# Patient Record
Sex: Female | Born: 1985 | Race: White | Hispanic: No | Marital: Single | Smoking: Former smoker
Health system: Southern US, Community
[De-identification: ages and names within clinical notes are randomized; demographics above are authoritative.]

## PROBLEM LIST (undated history)

## (undated) DIAGNOSIS — R768 Other specified abnormal immunological findings in serum: Secondary | ICD-10-CM

## (undated) DIAGNOSIS — F1110 Opioid abuse, uncomplicated: Secondary | ICD-10-CM

## (undated) DIAGNOSIS — B009 Herpesviral infection, unspecified: Secondary | ICD-10-CM

## (undated) HISTORY — PX: NO PAST SURGERIES: SHX2092

---

## 2009-01-29 ENCOUNTER — Emergency Department (HOSPITAL_COMMUNITY): Admission: EM | Admit: 2009-01-29 | Discharge: 2009-01-29 | Payer: Self-pay | Admitting: Emergency Medicine

## 2009-02-12 ENCOUNTER — Emergency Department (HOSPITAL_COMMUNITY): Admission: EM | Admit: 2009-02-12 | Discharge: 2009-02-12 | Payer: Self-pay | Admitting: Emergency Medicine

## 2009-02-20 ENCOUNTER — Emergency Department (HOSPITAL_COMMUNITY): Admission: EM | Admit: 2009-02-20 | Discharge: 2009-02-20 | Payer: Self-pay | Admitting: Emergency Medicine

## 2009-10-06 ENCOUNTER — Emergency Department (HOSPITAL_COMMUNITY): Admission: EM | Admit: 2009-10-06 | Discharge: 2009-10-06 | Payer: Self-pay | Admitting: Emergency Medicine

## 2009-10-07 ENCOUNTER — Emergency Department (HOSPITAL_COMMUNITY): Admission: EM | Admit: 2009-10-07 | Discharge: 2009-10-08 | Payer: Self-pay | Admitting: Emergency Medicine

## 2010-10-31 LAB — DIFFERENTIAL
Basophils Absolute: 0 10*3/uL (ref 0.0–0.1)
Basophils Relative: 1 % (ref 0–1)
Lymphocytes Relative: 31 % (ref 12–46)
Monocytes Absolute: 0.6 10*3/uL (ref 0.1–1.0)
Neutro Abs: 4.2 10*3/uL (ref 1.7–7.7)
Neutrophils Relative %: 59 % (ref 43–77)

## 2010-10-31 LAB — RAPID URINE DRUG SCREEN, HOSP PERFORMED
Amphetamines: NOT DETECTED
Tetrahydrocannabinol: NOT DETECTED

## 2010-10-31 LAB — CBC
HCT: 37.8 % (ref 36.0–46.0)
Hemoglobin: 13.1 g/dL (ref 12.0–15.0)
MCHC: 34.8 g/dL (ref 30.0–36.0)
MCV: 88.5 fL (ref 78.0–100.0)
Platelets: 297 10*3/uL (ref 150–400)
RBC: 4.26 MIL/uL (ref 3.87–5.11)
RDW: 12.4 % (ref 11.5–15.5)

## 2010-10-31 LAB — COMPREHENSIVE METABOLIC PANEL
Alkaline Phosphatase: 72 U/L (ref 39–117)
CO2: 29 mEq/L (ref 19–32)
Creatinine, Ser: 0.97 mg/dL (ref 0.4–1.2)
Glucose, Bld: 95 mg/dL (ref 70–99)
Total Bilirubin: 0.6 mg/dL (ref 0.3–1.2)

## 2010-10-31 LAB — WOUND CULTURE

## 2010-10-31 LAB — POCT PREGNANCY, URINE: Preg Test, Ur: NEGATIVE

## 2010-11-13 LAB — DIFFERENTIAL
Basophils Absolute: 0 10*3/uL (ref 0.0–0.1)
Basophils Relative: 1 % (ref 0–1)
Eosinophils Absolute: 0 10*3/uL (ref 0.0–0.7)
Eosinophils Relative: 1 % (ref 0–5)
Lymphocytes Relative: 25 % (ref 12–46)
Lymphs Abs: 1.1 10*3/uL (ref 0.7–4.0)
Monocytes Absolute: 0.5 10*3/uL (ref 0.1–1.0)
Monocytes Relative: 12 % (ref 3–12)
Neutro Abs: 2.6 10*3/uL (ref 1.7–7.7)
Neutrophils Relative %: 62 % (ref 43–77)

## 2010-11-13 LAB — CBC
HCT: 40.6 % (ref 36.0–46.0)
Hemoglobin: 14 g/dL (ref 12.0–15.0)
MCHC: 34.5 g/dL (ref 30.0–36.0)
MCV: 91.9 fL (ref 78.0–100.0)
Platelets: 285 10*3/uL (ref 150–400)
RBC: 4.42 MIL/uL (ref 3.87–5.11)
RDW: 11.5 % (ref 11.5–15.5)
WBC: 4.3 10*3/uL (ref 4.0–10.5)

## 2010-11-13 LAB — RAPID URINE DRUG SCREEN, HOSP PERFORMED
Amphetamines: NOT DETECTED
Barbiturates: NOT DETECTED
Benzodiazepines: NOT DETECTED
Cocaine: POSITIVE — AB
Opiates: POSITIVE — AB
Tetrahydrocannabinol: NOT DETECTED

## 2010-11-13 LAB — POCT I-STAT, CHEM 8
Calcium, Ion: 1.22 mmol/L (ref 1.12–1.32)
Creatinine, Ser: 0.8 mg/dL (ref 0.4–1.2)
HCT: 41 % (ref 36.0–46.0)
TCO2: 27 mmol/L (ref 0–100)

## 2010-11-13 LAB — URINALYSIS, ROUTINE W REFLEX MICROSCOPIC
Ketones, ur: NEGATIVE mg/dL
Protein, ur: NEGATIVE mg/dL
Specific Gravity, Urine: 1.026 (ref 1.005–1.030)
Urobilinogen, UA: 1 mg/dL (ref 0.0–1.0)
pH: 7 (ref 5.0–8.0)

## 2010-11-13 LAB — ETHANOL: Alcohol, Ethyl (B): <5 mg/dL (ref 0–10)

## 2010-11-13 LAB — URINE MICROSCOPIC-ADD ON

## 2010-11-13 LAB — POCT PREGNANCY, URINE: Preg Test, Ur: NEGATIVE

## 2011-04-23 ENCOUNTER — Emergency Department (HOSPITAL_COMMUNITY)
Admission: EM | Admit: 2011-04-23 | Discharge: 2011-04-23 | Disposition: A | Discharge status: Home / Self Care | Payer: Self-pay | Attending: Emergency Medicine | Admitting: Emergency Medicine

## 2011-04-23 DIAGNOSIS — N390 Urinary tract infection, site not specified: Secondary | ICD-10-CM | POA: Insufficient documentation

## 2011-04-23 DIAGNOSIS — F1910 Other psychoactive substance abuse, uncomplicated: Secondary | ICD-10-CM | POA: Insufficient documentation

## 2011-04-23 LAB — URINALYSIS, ROUTINE W REFLEX MICROSCOPIC
Ketones, ur: NEGATIVE mg/dL
Nitrite: NEGATIVE
Urobilinogen, UA: 0.2 mg/dL (ref 0.0–1.0)

## 2011-04-23 LAB — CBC
Hemoglobin: 15.8 g/dL — ABNORMAL HIGH (ref 12.0–15.0)
MCV: 88.9 fL (ref 78.0–100.0)
RDW: 12.4 % (ref 11.5–15.5)

## 2011-04-23 LAB — RAPID URINE DRUG SCREEN, HOSP PERFORMED
Amphetamines: NOT DETECTED
Benzodiazepines: POSITIVE — AB
Cocaine: NOT DETECTED

## 2011-04-23 LAB — DIFFERENTIAL
Basophils Relative: 1 % (ref 0–1)
Eosinophils Absolute: 0 10*3/uL (ref 0.0–0.7)
Eosinophils Relative: 0 % (ref 0–5)
Lymphocytes Relative: 15 % (ref 12–46)
Neutro Abs: 8.4 10*3/uL — ABNORMAL HIGH (ref 1.7–7.7)
Neutrophils Relative %: 75 % (ref 43–77)

## 2011-04-23 LAB — COMPREHENSIVE METABOLIC PANEL
ALT: 10 U/L (ref 0–35)
AST: 16 U/L (ref 0–37)
Albumin: 4.4 g/dL (ref 3.5–5.2)
Alkaline Phosphatase: 73 U/L (ref 39–117)
Chloride: 94 mEq/L — ABNORMAL LOW (ref 96–112)
Potassium: 3.9 mEq/L (ref 3.5–5.1)
Sodium: 132 mEq/L — ABNORMAL LOW (ref 135–145)
Total Bilirubin: 1.4 mg/dL — ABNORMAL HIGH (ref 0.3–1.2)

## 2011-04-23 LAB — URINE MICROSCOPIC-ADD ON

## 2011-04-23 LAB — ACETAMINOPHEN LEVEL: Acetaminophen (Tylenol), Serum: <15 ug/mL (ref 10–30)

## 2011-11-18 ENCOUNTER — Emergency Department (HOSPITAL_COMMUNITY)
Admission: EM | Admit: 2011-11-18 | Discharge: 2011-11-19 | Disposition: A | Discharge status: Home / Self Care | Payer: Self-pay | Attending: Emergency Medicine | Admitting: Emergency Medicine

## 2011-11-18 ENCOUNTER — Emergency Department (HOSPITAL_COMMUNITY): Payer: Self-pay

## 2011-11-18 DIAGNOSIS — R4182 Altered mental status, unspecified: Secondary | ICD-10-CM | POA: Insufficient documentation

## 2011-11-18 DIAGNOSIS — F1910 Other psychoactive substance abuse, uncomplicated: Secondary | ICD-10-CM

## 2011-11-18 DIAGNOSIS — T401X1A Poisoning by heroin, accidental (unintentional), initial encounter: Secondary | ICD-10-CM | POA: Insufficient documentation

## 2011-11-18 DIAGNOSIS — R0989 Other specified symptoms and signs involving the circulatory and respiratory systems: Secondary | ICD-10-CM | POA: Insufficient documentation

## 2011-11-18 DIAGNOSIS — T401X4A Poisoning by heroin, undetermined, initial encounter: Secondary | ICD-10-CM | POA: Insufficient documentation

## 2011-11-18 DIAGNOSIS — R5381 Other malaise: Secondary | ICD-10-CM | POA: Insufficient documentation

## 2011-11-18 LAB — SALICYLATE LEVEL: Salicylate Lvl: <2 mg/dL — ABNORMAL LOW (ref 2.8–20.0)

## 2011-11-18 LAB — DIFFERENTIAL
Basophils Relative: 0 % (ref 0–1)
Eosinophils Absolute: 0.1 10*3/uL (ref 0.0–0.7)
Lymphs Abs: 1.2 10*3/uL (ref 0.7–4.0)
Monocytes Absolute: 0.7 10*3/uL (ref 0.1–1.0)
Monocytes Relative: 9 % (ref 3–12)

## 2011-11-18 LAB — CBC
HCT: 37.2 % (ref 36.0–46.0)
Hemoglobin: 12.8 g/dL (ref 12.0–15.0)
MCH: 30.3 pg (ref 26.0–34.0)
MCHC: 34.4 g/dL (ref 30.0–36.0)
RBC: 4.22 MIL/uL (ref 3.87–5.11)

## 2011-11-18 LAB — RAPID URINE DRUG SCREEN, HOSP PERFORMED
Amphetamines: POSITIVE — AB
Barbiturates: NOT DETECTED
Benzodiazepines: POSITIVE — AB

## 2011-11-18 LAB — BASIC METABOLIC PANEL
BUN: 10 mg/dL (ref 6–23)
Chloride: 102 mEq/L (ref 96–112)
Creatinine, Ser: 0.6 mg/dL (ref 0.50–1.10)
GFR calc Af Amer: >90 mL/min (ref >90)
Glucose, Bld: 99 mg/dL (ref 70–99)

## 2011-11-18 LAB — ETHANOL: Alcohol, Ethyl (B): <11 mg/dL (ref 0–11)

## 2011-11-18 MED ORDER — SODIUM CHLORIDE 0.9 % IV SOLN
INTRAVENOUS | Status: DC
  Administered 2011-11-18: 17:00:00 via INTRAVENOUS

## 2011-11-18 MED ORDER — NALOXONE HCL 0.4 MG/ML IJ SOLN: 0.4000 mg | INTRAMUSCULAR | Status: DC | PRN

## 2011-11-18 NOTE — BH Assessment (Signed)
IVC paperwork signed and faxed by Clinical research associate. [employer] confirms receipt.

## 2011-11-18 NOTE — ED Provider Notes (Addendum)
Signed out by Dr Fredricka Bonine that pt had unintentional heroine od. To observe in ED, anticipating will be able to be discharge home. On recheck pt declines wanting substance abuse rehab/detox.  On multiple rechecks pt either awake, or resting and easily arousable at which time is alert, oriented. Pt denies any c/o. Requests d/c. Will have staff work w pt to find responsible adult.  Pt continues to deny any thought of harm to self.  Have encouraged patient to consider substance abuse rehab/detox, pt refuses.    Additional recheck 2300, alert, content. Hr 94 rr 16, pulse ox 98%.   Recheck [phone] - remains fully awake and alert. No resp depression. No sob. Continues to request d/c and again refuses substance abuse rehab/detox program or other inpt stay.      Suzi Roots, MD 11/18/11 6213  Suzi Roots, MD 11/19/11 [phone]

## 2011-11-18 NOTE — ED Notes (Signed)
I called security after RN Vincenza Hews felt that the patient was a flight risk. I waited by the patients room until Security officer Katrina was standing by and explained to her that the patient was flight risk. Approx. 2 minutes later, Vincenza Hews asked me where the patient was. I was not aware that she was missing. I ran out the ambulance bay and saw that [employer] had caught the patient across the street. The patient was brought back to room 15.

## 2011-11-18 NOTE — ED Notes (Signed)
At approx 1940, pt became less cooperative, agitated, reported that she was wanting to leave. Pt still with slurred speech and falling asleep during sentences. NT was instructed to call security. Security came to bedside and pt began to run, pulling PIV. MD notified of patient erratic and unsafe behavior, MD orders IVC.

## 2011-11-18 NOTE — ED Provider Notes (Addendum)
History     CSN: [id number]  Arrival date & time 11/18/11  1425   First MD Initiated Contact with Patient 11/18/11 1442      Chief Complaint  Patient presents with  . 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 She was found today in the restroom at a local laundromat/bar passed out and unresponsive. EMS were called and found the patient to be unresponsive, gave a dose of naloxone IV with temporary resolution of altered mental status and lucid interval during which the patient admitted to using heroin today, denies intentional overdose. On arrival in the emergency department, the patient is lethargic but responsive to verbal stimulus, oriented appropriately, able to answer some questions. She again to me denies intentional overdose or coingestants. She is upfront in reporting her drug use and her recent relapse of substance abuse. She states that she is "very well connected" in the substance abuse recovery community and has plenty of resources outside of the hospital that she simply needs to choose to use. She does not request any additional intervention for substance abuse today in the emergency department such as evaluation by the behavioral health team for inpatient placement for treatment of substance abuse. She states that she would prefer to obtain sobriety and be discharged to followup with her narcotic abuse support group. She states that she has been prescribed Suboxone that she is going to pick up tomorrow evening to treat her drug dependence.  Patient is a 26 y.o. female presenting with Overdose. The history  is provided by the patient and the EMS personnel. The history is limited by the condition of the patient (the patient is lethargic from accidental drug overdose).  Drug Overdose This is a recurrent problem. Episode onset: Unknown. The problem has been gradually improving. Pertinent negatives include no chest pain, no abdominal pain, no headaches and no shortness of breath. The symptoms are aggravated by nothing. Relieved by: Narcan. Treatments tried: Narcan. The treatment provided significant (Temporary relief for EMS after giving the patient Narcan, with intermittent lucid interval.) relief.    No past medical history on file.  No past surgical history on file.  No family history on file.  History  Substance Use Topics  . Smoking status: Not on file  . Smokeless tobacco: Not on file  . Alcohol Use: Not on file    OB History    No data available      Review of Systems  Unable to perform ROS: Mental status change  Respiratory: Negative for shortness of breath.   Cardiovascular: Negative for chest pain.  Gastrointestinal: Negative for abdominal pain.  Neurological: Negative for headaches.    Allergies  Review of patient's allergies indicates no known allergies.  Home Medications   Current Outpatient Rx  Name Route Sig Dispense Refill  . ACETAMINOPHEN 500 MG PO TABS Oral Take 500 mg by mouth every 6 (six) hours as needed. For pain relief    . IBUPROFEN 400 MG PO TABS Oral Take 400 mg by mouth every 6 (six) hours as needed. For pain    . ADULT MULTIVITAMIN W/MINERALS CH Oral Take 1 tablet by mouth daily.      BP  135/67  Pulse 105  Temp(Src) 97.5 F (36.4 C) (Oral)  Resp 14  SpO2 100%  Physical Exam  Nursing note and vitals reviewed. Constitutional: She is oriented to person, place, and time. She appears well-developed and well-nourished. She appears lethargic. No distress.  HENT:  Head: Normocephalic and atraumatic.  Mouth/Throat: Oropharynx is clear and moist.    Eyes: Conjunctivae and EOM are normal.       Pinpoint pupils bilaterally  Neck: Normal range of motion. Neck supple.  Cardiovascular: Normal rate, regular rhythm, normal heart sounds and intact distal pulses.  Exam reveals no gallop and no friction rub.   No murmur heard. Pulmonary/Chest: Effort normal. No respiratory distress. She has no wheezes. She has rhonchi in the right middle field, the right lower field, the left middle field and the left lower field. She has no rales. She exhibits no tenderness.  Abdominal: Soft. Bowel sounds are normal. She exhibits no distension. There is no tenderness. There is no rebound and no guarding.  Musculoskeletal: Normal range of motion. She exhibits no edema and no tenderness.  Neurological: She is oriented to person, place, and time. She has normal reflexes. She appears lethargic. She displays no tremor. No cranial nerve deficit. She exhibits normal muscle tone. She displays no seizure activity. Coordination normal. GCS eye subscore is 3. GCS verbal subscore is 5. GCS motor subscore is 6.       Lethargic but responsive to voice, answering questions appropriately before starting to fall back asleep.  Skin: Skin is warm and dry. No rash noted. She is not diaphoretic. No erythema. No pallor.  Psychiatric:       Lethargic, somnolent, expresses remorse and regret for resumption of substance abuse.    ED Course  Procedures (including critical care time)   Date: 11/18/2011  Rate: 114  Rhythm: sinus tachycardia  QRS Axis: normal  Intervals: normal  ST/T Wave abnormalities: normal  Conduction Disutrbances: none  Narrative Interpretation: unremarkable      Labs Reviewed  CBC  DIFFERENTIAL  BASIC METABOLIC PANEL  URINE RAPID DRUG SCREEN (HOSP PERFORMED)  ETHANOL  SALICYLATE LEVEL  ACETAMINOPHEN LEVEL   No results found.   No diagnosis found.    MDM  The patient appears not to have ingested any acetaminophen, salicylates, or alcohol but  results of the urine drug screen are still pending. Her electrolytes are normal, her CBC and differential are normal, her vital signs are stable, and she is maintaining her own airway and respirations at this time. I given the nursing staff when necessary orders for naloxone with the caveat to notify the physician if they need to use it. Otherwise, the patient will be allowed to obtain sobriety after which she is safe to discharge for outpatient substance abuse treatment.        Felisa Bonier, MD 11/18/11 [phone]

## 2011-11-18 NOTE — ED Notes (Signed)
EMS called [facility].  Found patient unresponsive in bathroom Patient was breathing without response to sternal rub.   No IV access.  CGB 111.   BP 112/86  HR 133 ST.

## 2011-11-19 NOTE — Discharge Instructions (Signed)
Do not use drugs - follow up with Narcotics Anonymous and use referral guide provided.  Also follow up with primary care doctor in coming week.  Return to ER if worse, new symptoms, trouble breathing, depression, thoughts of self harm, other concern.      RESOURCE GUIDE  Dental Problems  Patients with Medicaid: [facility] [phone]                                           [phone] [address] Phone:  [phone]                                                  Phone:  [phone]  If unable to pay or uninsured, contact:  [facility] or [facility] to become qualified for the adult dental clinic.  Chronic Pain Problems Contact [facility]  [phone] Patients need to be referred by their primary care doctor.  Insufficient Money for Medicine Contact [employer]:  call "211" or [facility] [phone])534-8652.  No Primary Care Doctor Call [facility]  [phone] Other agencies that provide inexpensive medical care    [facility]  [phone]    [facility]  [phone]    [facility]  [phone]    [facility]  [phone]    [facility]  [phone]    [facility]  [phone]  Psychological Services [facility]  [phone] [facility]  [phone] [facility]   [phone] (emergency services [phone])  Substance Abuse Resources [facility]  [phone] [facility] [phone] [facility] [place] [phone] Floydene Flock [phone] Residential & Outpatient Substance Abuse Program  [phone]  Abuse/Neglect [facility] Child Abuse Hotline 620-744-9598 [facility] Child Abuse Hotline 831-540-3967 (After Hours)  Emergency Shelter [facility] [phone]  M[facility] [place] [facility] [place] [phone] [facility]  Services [phone]  MRSA Hotline #:   559-394-2998    [facility] Resources  [facility] [place]     [employer]                          [facility] [address] [place]                       [address]      [address]  [place]                                                Cristobal Goldmann Phone:  [phone]  Phone:  [phone]                 Phone:  [phone]  [facility] Phone:  [phone]  [facility] Child Abuse Hotline 604-829-8643 [phone] (After Hours)        Substance Abuse Your exam indicates that you have a problem with substance abuse. Substance abuse is the misuse of alcohol or drugs that causes problems in family life, friendships, and work relationships. Substance abuse is the most important cause of premature illness, disability, and death in our society. It is also the greatest threat to a person's mental and spiritual well being. Substance abuse can start out in an innocent way, such as social drinking or taking a little extra medication prescribed by your doctor. No one starts out with the intention of becoming an alcoholic or an addict. Substance abuse victims cannot control their use of alcohol or drugs. They may become intoxicated daily or go on weekend binges. Often there is a strong desire to quit, but attempts to stop using often fail. Encounters with law enforcement or conflicts with family members, friends, and work associates are signs of a potential problem. Recovery is always possible, although the craving for some drugs makes it difficult to quit without assistance. Many treatment programs are available to help people stop abusing alcohol or drugs. The first step in treatment is to admit you have a problem. This is a major hurdle because denial is a powerful force with substance abuse. Alcoholics Anonymous,  Narcotics Anonymous, Cocaine Anonymous, and other recovery groups and programs can be very useful in helping people to quit. If you do not feel okay about your drug or alcohol use and if it is causing you trouble, we want to encourage you to talk about it with your doctor or with someone from a recovery group who can help you. You could also call the [employer] on Drug Abuse at 1-800-662-HELP. It is up to you to take the first step. AL-ANON and ALA-TEEN are support groups for friends and family members of an alcohol or drug dependent person. The people who love and care for the alcoholic or addicted person often need help, too. For information about these organizations, check your phone directory or call a local alcohol or drug treatment center. Document Released: 08/31/2004 Document Revised: 07/13/2011 Document Reviewed: 07/25/2008 [facility] Patient Information 2012 [place], [place].      Heroin Abuse and Withdrawal Heroin is an illegal opiate (narcotic) drug. It is very addictive. Once the effects of the drug are felt, the need to use the drug again (known as craving) is strong. When the drug is suddenly stopped after using it on a regular basis, significant and painful physical withdrawal symptoms are experienced.  EFFECTS OF HEROIN USE AND ABUSE The drug produces a profound feeling of pleasure and relaxation (euphoria) that lasts for a short time. This is followed by sleepiness and then agitation with craving for more drugs. After a short time of regular use (days or weeks), dependence on the drug is developed. This means the user will become ill without it (withdrawal) and needs to keep using the drug to function. This is how fast heroin abuse becomes physical dependence and addiction (chemical dependency). EFFECTS ON THE BRAIN Heroin is addictive because it activates regions of the brain that are responsible for producing both the pleasurable sensation of "reward" and physical dependence.  Together, these actions account for the user's loss of control and the rapid  development of drug dependence. HOW IT IS USED  Heroin is a drug that is mostly taken by a shot with a needle (injection) in the vein. Using a needle can have harmful effects on the user. Often times, the needle is unclean (unsterile), the heroin is contaminated with cutting agents, or heroin is used in combination with other drugs such as alcohol or cocaine.   In recent years, the purity of street heroin has increased a lot allowing users to "snort" the drug into their noses where it is absorbed through the lining of the nasal passages. You can become addicted this way. Many IV heroin users report they started by snorting.  RELATED PROBLEMS  Needle sharing can cause AIDS. The AIDS virus is carried in contaminated blood left in the needle, syringe or other drug-related equipment. When unclean needles are used, the AIDS virus is injected into the new user. There is no cure or proven vaccine for AIDS to prevent it.   Heroin use during pregnancy is associated with stillbirths. It can also increase the chance for miscarriage. Babies born addicted to heroin must undergo withdrawal after birth. These babies show a number of developmental problems.   Heroin use can cause serious health problems such as liver infection (hepatitis), skin abscesses, vein inflammation and heart disease.  SYMPTOMS The signs and symptoms of heroin use include:  Euphoria.   Drowsiness.   Respiratory depression (which can progress until breathing stops).   Small (constricted) pupils and nausea.   Need for increasingly higher doses of the drug to get the same effect.   Weight loss.   "Track" marks (scars) on arms and legs.   Emotional instability.  Symptoms of a heroin overdose include:   Shallow breathing.   Pinpoint pupils.   Clammy skin.   Convulsion.   Coma.  WITHDRAWAL EFFECTS Withdrawal symptoms include:  Watery eyes.    Runny nose.   Yawning.   Loss of appetite.   Tremors.   Panic.   Chills.   Sweating.   Nausea.   Muscle cramps.   Not being able to sleep well (insomnia).   Depression and mood swings.   Elevations in blood pressure, pulse, respiratory rate and temperature.  RISK OF OVERDOSE Because the drug is produced illegally, heroin users risk taking an unusually potent dose (due to differences in purity). This can lead to overdose, coma and possible death. Especially dangerous is the situation where a user has been "clean" (abstinent) from using the drug for a period of time and then relapses. Fatal overdoses can occur because the user fails to account for the change in the body's tolerance to the drug. TREATMENT  There are many treatment options for heroin addiction. They include medications as well as behavioral therapies. Research has demonstrated that many people can successfully stop using heroin when medication is combined with other supportive services. Patients can return to stable and productive lives, living completely free of all narcotics. Some of the medications and other treatment approaches used are:  Methadone. This is a medication that blocks the effects of heroin for about 24 hours. It has a proven record of success when prescribed at a high enough dosage level for people addicted to heroin.   Naloxone may be used to treat cases of overdose, as well as naltrexone. Both of these block the effects of morphine, heroin and other opiates. People who have become free of narcotics often take naltrexone to avoid relapse.   Buprenorphine is now available for treating  addiction to heroin and other opiates. This medication offers less risk of addiction. It can be dispensed in the privacy of a doctor's office.   Several other medications for use in heroin treatment programs are also under study.   There are many effective behavioral treatments available for heroin addiction. These  can include residential and outpatient approaches. 12-step programs, such as Narcotics Anonymous, have also proven to be effective.  HOME CARE INSTRUCTIONS  Some individuals can successfully stop using heroin at home with medically assisted detoxification. This requires following your caregiver's instructions very carefully. This may include use of some of the following medications and therapies:  Stomach medicine can be used for the nausea.   Imodium can be used for the diarrhea.   Benadryl can be used for the sleeplessness and restlessness.   Anxiety medication such as Xanax or Ativan. These must be administered exactly as prescribed, usually with the help of a home caregiver.   Avoiding dehydration by drinking more fluids than usual. While water alone is fine, any non-alcoholic fluid is okay (soup, juice, etc.).   Providing quiet, calm support and cooling or warmth as needed.   Call your local emergency services (911 in [place] if seizures occur or if the patient is unable to keep liquids down.   Keep a written record of medications given and times given.  Addiction cannot be cured but it can be treated successfully.   Treatment centers are listed in the yellow pages under:   Substance Abuse.   Narcotics Anonymous.   Drug Abuse Counseling.   Most hospitals and clinics can refer you to a specialized care center.   The [place] government maintains a toll-free number for obtaining treatment referrals:1-919-717-2180 or 848-449-4431 (TDD) and maintains a website: http://findtreatment.RockToxic.pl.   Other websites for additional information are:   www.mentalhealth.RockToxic.pl.   GreatestFeeling.tn.   In [place] treatment resources are listed in each [place] with listings available under:   Oncologist for [employer] or similar titles.  Document Released: 07/27/2003 Document Revised: 07/13/2011 Document Reviewed: 07/10/2008 [facility] Patient Information 2012 [place],  [place].    Cocaine Abuse and Chemical Dependency WHEN IS DRUG USE A PROBLEM? Anytime drug use is interfering with normal living activities it has become abuse. This includes problems with family and friends. Psychological dependence has developed when your mind tells you that the drug is needed. This is usually followed by physical dependence which has developed when continuing increases of drug are required to get the same feeling or "high". This is known as addiction or chemical dependency. A person's risk is much higher if there is a history of chemical dependency in the family. SIGNS OF CHEMICAL DEPENDENCY:  Been told by friends or family that drugs have become a problem.   Fighting when using drugs.   Having blackouts (not remembering what you do while using).   Feel sick from using drugs but continue using.   Lie about use or amounts of drugs (chemicals) used.   Need chemicals to get you going.   Suffer in work International aid/development worker or school because of drug use.   Get sick from use of drugs but continue to use anyway.   Need drugs to relate to people or feel comfortable in social situations.   Use drugs to forget problems.  Yes answered to any of the above signs of chemical dependency indicates there are problems. The longer the use of drugs continues, the greater the problems will become. If there is a family history of  drug or alcohol use it is best not to experiment with these drugs. Experimentation leads to tolerance and needing to use more of the drug to get the same feeling. This is followed by addiction where drugs become the most important part of life. It becomes more important to take drugs than participate in the other usual activities of life including relating to friends and family. Addiction is followed by dependency where drugs are now needed not just to get high but to feel normal. Addiction cannot be cured but it can be stopped. This often requires outside help and the care  of professionals. Treatment centers are listed in the yellow pages under: Cocaine, Narcotics, and Alcoholics Anonymous. Most hospitals and clinics can refer you to a specialized care center. WHAT IS COCAINE? Cocaine is a strong nervous system stimulant which speeds up the body and gives the user the feeling that they have increased energy, loss of appetite and feelings of great pleasure. This "high" which begins within several minutes and lasts for less than an hour is followed by a "crash". The crash and depressed feelings that come with it cause a craving for the drug to regain the high. HOW IS COCANINE USED? Cocaine is snorted, injected, and smoked as free- base or crack. Because smoking the drug produces a greater high it is also associated with a greater low. It is therefore more rapidly addicting. WHAT ARE THE EFFECTS OF COCAINE? It is an anesthetic (pain killer) and a stimulant (it causes a high which gives a false feeling of well being). It increases heart and breathing rates with increases in body temperature and blood pressure. It removes appetite. It causes seizures (convulsions) along with nausea (feeling sick to your stomach), vomiting and stomach pain. This dangerous combination can lead to death. Trying to keep the high feeling leads to greater and greater drug use and this leads to addiction. Addiction can only be helped by stopping use of all chemicals. This is hard but may save your life. If the addiction is continued, the only possible outcome is loss of self respect and self esteem, violence, death, and eventually prison if the addict is fortunate enough to be caught and able to receive help prior to this last life ending event. OTHER HEALTH RISKS OF COCAINE AND ALL DRUG USE ARE:  The increased possibility of getting AIDS or hepatitis (liver inflammation).   Having a baby born which is addicted to cocaine and must go through painful withdrawal including shaking, jerking, and crying in  pain. Many of the babies die. Other babies go through life with lifelong disabilities and learning problems.  HOW TO STAY DRUG FREE ONCE YOU HAVE QUIT USING:  Develop healthy activities and form friends who do not use drugs.   Stay away from the drug scene.   Tell the pusher or former friend you have other better things to do.   Have ready excuses available about why you cannot use.  For more help or information contact your local physician, clinic, hospital or dial 1-800-cocaine 712-092-2249). Document Released: 07/21/2000 Document Revised: 07/13/2011 Document Reviewed: 03/11/2008 [facility] Patient Information 2012 [place], [place].

## 2012-08-07 NOTE — L&D Delivery Note (Signed)
Delivery Note At 3:06 PM a viable female was delivered via NSVD (Presentation:LOA ;  ).  APGAR:8/9 , ; weight .   Placenta status:intact , . 3 vessel Cord:  with the following complicationsnone: .  Cord pH: none  Anesthesia:  epidural Episiotomy: none Lacerations: second degree Suture Repair: 2.0 chromic Est. Blood Loss (mL): 400  Mom to postpartum.  Baby to nursery-stable.  [name] S 04/23/2013, 3:49 PM

## 2012-10-07 LAB — OB RESULTS CONSOLE ABO/RH: RH Type: POSITIVE

## 2012-10-07 LAB — OB RESULTS CONSOLE RUBELLA ANTIBODY, IGM: Rubella: IMMUNE

## 2012-10-07 LAB — OB RESULTS CONSOLE RPR: RPR: NONREACTIVE

## 2012-10-07 LAB — OB RESULTS CONSOLE HIV ANTIBODY (ROUTINE TESTING): HIV: NONREACTIVE

## 2012-10-09 ENCOUNTER — Other Ambulatory Visit: Payer: Self-pay

## 2012-10-10 ENCOUNTER — Other Ambulatory Visit (HOSPITAL_COMMUNITY): Payer: Self-pay | Admitting: Obstetrics and Gynecology

## 2012-10-10 DIAGNOSIS — O99321 Drug use complicating pregnancy, first trimester: Secondary | ICD-10-CM

## 2012-10-10 DIAGNOSIS — Z3689 Encounter for other specified antenatal screening: Secondary | ICD-10-CM

## 2012-10-15 ENCOUNTER — Other Ambulatory Visit: Payer: Self-pay

## 2012-11-12 ENCOUNTER — Encounter (HOSPITAL_COMMUNITY): Payer: Self-pay | Admitting: Obstetrics and Gynecology

## 2012-11-19 ENCOUNTER — Ambulatory Visit (HOSPITAL_COMMUNITY)
Admission: RE | Admit: 2012-11-19 | Discharge: 2012-11-19 | Disposition: A | Discharge status: Home / Self Care | Payer: BC Managed Care – PPO | Source: Ambulatory Visit | Attending: Obstetrics and Gynecology | Admitting: Obstetrics and Gynecology

## 2012-11-19 ENCOUNTER — Encounter (HOSPITAL_COMMUNITY): Payer: Self-pay

## 2012-11-19 DIAGNOSIS — F1910 Other psychoactive substance abuse, uncomplicated: Secondary | ICD-10-CM | POA: Insufficient documentation

## 2012-11-19 DIAGNOSIS — A6000 Herpesviral infection of urogenital system, unspecified: Secondary | ICD-10-CM | POA: Insufficient documentation

## 2012-11-19 DIAGNOSIS — O99340 Other mental disorders complicating pregnancy, unspecified trimester: Secondary | ICD-10-CM | POA: Insufficient documentation

## 2012-11-19 DIAGNOSIS — Z3689 Encounter for other specified antenatal screening: Secondary | ICD-10-CM

## 2012-11-19 DIAGNOSIS — O358XX0 Maternal care for other (suspected) fetal abnormality and damage, not applicable or unspecified: Secondary | ICD-10-CM | POA: Insufficient documentation

## 2012-11-19 DIAGNOSIS — O99321 Drug use complicating pregnancy, first trimester: Secondary | ICD-10-CM

## 2012-11-19 DIAGNOSIS — O09899 Supervision of other high risk pregnancies, unspecified trimester: Secondary | ICD-10-CM | POA: Insufficient documentation

## 2012-11-19 DIAGNOSIS — O98519 Other viral diseases complicating pregnancy, unspecified trimester: Secondary | ICD-10-CM | POA: Insufficient documentation

## 2013-02-04 ENCOUNTER — Ambulatory Visit (HOSPITAL_COMMUNITY)
Admission: RE | Admit: 2013-02-04 | Discharge: 2013-02-04 | Disposition: A | Discharge status: Home / Self Care | Payer: BC Managed Care – PPO | Source: Ambulatory Visit | Attending: Obstetrics and Gynecology | Admitting: Obstetrics and Gynecology

## 2013-02-04 DIAGNOSIS — O98519 Other viral diseases complicating pregnancy, unspecified trimester: Secondary | ICD-10-CM | POA: Insufficient documentation

## 2013-02-04 DIAGNOSIS — B181 Chronic viral hepatitis B without delta-agent: Secondary | ICD-10-CM | POA: Insufficient documentation

## 2013-02-04 NOTE — Progress Notes (Signed)
MATERNAL FETAL MEDICINE CONSULT  Patient Name: Tiffany Butler Medical Record Number:  [id number] Date of Birth: Jul 17, 1986 Requesting Physician Name:  Juluis Mire, MD Date of Service: 02/04/2013  Chief Complaint Chronic Hepatitis C  History of Present Illness Tiffany Butler was seen today secondary to chronic hepatitis C at the request of Juluis Mire, MD.  The patient is a 27 y.o. G2P0010,at [redacted]w[redacted]d with an EDD of 04/22/2013, by Ultrasound dating method.  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  She reports being drug free for approximately 1 year; however, a relapse in the first trimester is noted in her outside prenatal records.  She was recently seen my a gastroenterologist who recommended deferring further workup and treatment until after delivery.  A recent Hep C viral load was 6,450,216, with an AST and ALT of 44 and 55, respectively.  Her Hep C genotype is 1a.  She had an anatomy scan at the [facility] which was normal.  Review of Systems Pertinent items are noted in HPI.  Patient History OB History   Grav Para Term Preterm Abortions TAB SAB Ect Mult Living   2 0 0 0 1 1 0 0 0 0      # Outc Date GA Lbr Len/2nd Wgt Sex Del Anes PTL Lv   1 TAB            2 CUR               No past medical history on file.  No past surgical history on file.  History   Social History  . Marital Status: Single    Spouse Name: N/A    Number of Children: N/A  . Years of Education: N/A   Social History Main Topics  . Smoking status: Not on file  . Smokeless tobacco: Not on file  . Alcohol Use: Not on file  . Drug Use: Not on file  . Sexually Active: Not on file   Other Topics Concern  . Not on file   Social History Narrative  . No narrative on file    No family history on file. In addition, the patient has no family history of mental retardation, birth defects, or genetic diseases.  Physical Examination Filed Vitals:   02/04/13 0909  BP: 114/66  Pulse: 83   General  appearance - alert, well appearing, and in no distress Eyes - sclera anicteric Skin - no evidence of jaundice  Assessment and Recommendations 1.  Hepatitis C.  Tiffany Butler has a relatively high viral load of over 6 million.  The overall risk of vertical transmission is approximately 2%.  However, this may be higher in Tiffany Butler case, approximately 4%, due to the high viral load.   However, the safety of currently available therapies in pregnancy are unclear and their ability to prevent vertical transmission  is uncertain.  Thus, I concur with her gastroenterologist that further workup and therapy should be deferred until after delivery.  There are no know perinatal therapies to reduce the risk of neonatal Hep C transmission, such as vaccination and passive immunization that are available for Hep B.  Furthermore, cesarean delivery has not been shown to decrease the risk of transmission.  Therefore, I recommend proceeding with routine prenatal care and reserving cesarean delivery for standard obstetrical indications.  She should follow up with her gastroenterologist soon after delivery.  I spent 15 minutes with Tiffany Butler today of which 50% was face-to-face counseling.   Rema Fendt, MD

## 2013-04-22 ENCOUNTER — Inpatient Hospital Stay (HOSPITAL_COMMUNITY)
Admission: AD | Admit: 2013-04-22 | Discharge: 2013-04-25 | DRG: 372 | Disposition: A | Discharge status: Home / Self Care | Payer: BC Managed Care – PPO | Source: Ambulatory Visit | Attending: Obstetrics and Gynecology | Admitting: Obstetrics and Gynecology

## 2013-04-22 DIAGNOSIS — F1920 Other psychoactive substance dependence, uncomplicated: Secondary | ICD-10-CM | POA: Diagnosis present

## 2013-04-22 DIAGNOSIS — R7989 Other specified abnormal findings of blood chemistry: Secondary | ICD-10-CM | POA: Diagnosis present

## 2013-04-22 DIAGNOSIS — A6000 Herpesviral infection of urogenital system, unspecified: Secondary | ICD-10-CM | POA: Diagnosis present

## 2013-04-22 DIAGNOSIS — F1120 Opioid dependence, uncomplicated: Secondary | ICD-10-CM | POA: Diagnosis present

## 2013-04-22 DIAGNOSIS — O98519 Other viral diseases complicating pregnancy, unspecified trimester: Secondary | ICD-10-CM | POA: Diagnosis present

## 2013-04-22 DIAGNOSIS — Z8619 Personal history of other infectious and parasitic diseases: Secondary | ICD-10-CM

## 2013-04-22 HISTORY — DX: Other specified abnormal immunological findings in serum: R76.8

## 2013-04-22 HISTORY — DX: Herpesviral infection, unspecified: B00.9

## 2013-04-22 HISTORY — DX: Opioid abuse, uncomplicated: F11.10

## 2013-04-23 ENCOUNTER — Inpatient Hospital Stay (HOSPITAL_COMMUNITY): Payer: BC Managed Care – PPO | Admitting: Anesthesiology

## 2013-04-23 ENCOUNTER — Encounter (HOSPITAL_COMMUNITY): Payer: Self-pay | Admitting: Anesthesiology

## 2013-04-23 ENCOUNTER — Encounter (HOSPITAL_COMMUNITY): Payer: Self-pay | Admitting: *Deleted

## 2013-04-23 LAB — CBC
Hemoglobin: 12.6 g/dL (ref 12.0–15.0)
MCH: 31.7 pg (ref 26.0–34.0)
MCHC: 35.2 g/dL (ref 30.0–36.0)
MCV: 89.9 fL (ref 78.0–100.0)
Platelets: 266 10*3/uL (ref 150–400)
RBC: 3.98 MIL/uL (ref 3.87–5.11)

## 2013-04-23 LAB — RAPID URINE DRUG SCREEN, HOSP PERFORMED
Benzodiazepines: NOT DETECTED
Cocaine: NOT DETECTED

## 2013-04-23 LAB — RPR: RPR Ser Ql: NONREACTIVE

## 2013-04-23 MED ORDER — ONDANSETRON HCL 4 MG/2ML IJ SOLN: 4.0000 mg | INTRAMUSCULAR | Status: DC | PRN

## 2013-04-23 MED ORDER — LACTATED RINGERS IV SOLN
500.0000 mL | Freq: Once | INTRAVENOUS | Status: AC
  Administered 2013-04-23: 500 mL via INTRAVENOUS

## 2013-04-23 MED ORDER — IBUPROFEN 600 MG PO TABS: 600.0000 mg | ORAL_TABLET | Freq: Four times a day (QID) | ORAL | Status: DC | PRN

## 2013-04-23 MED ORDER — OXYCODONE-ACETAMINOPHEN 5-325 MG PO TABS: 1.0000 | ORAL_TABLET | ORAL | Status: DC | PRN

## 2013-04-23 MED ORDER — BISACODYL 10 MG RE SUPP: 10.0000 mg | Freq: Every day | RECTAL | Status: DC | PRN

## 2013-04-23 MED ORDER — TERBUTALINE SULFATE 1 MG/ML IJ SOLN: 0.2500 mg | Freq: Once | INTRAMUSCULAR | Status: DC | PRN

## 2013-04-23 MED ORDER — EPHEDRINE 5 MG/ML INJ
10.0000 mg | INTRAVENOUS | Status: DC | PRN
  Filled 2013-04-23: qty 2
  Filled 2013-04-23: qty 4

## 2013-04-23 MED ORDER — FENTANYL 2.5 MCG/ML BUPIVACAINE 1/10 % EPIDURAL INFUSION (WH - ANES)
INTRAMUSCULAR | Status: DC | PRN
Start: 2013-04-23 — End: 2013-04-23
  Administered 2013-04-23: 14 mL/h via EPIDURAL

## 2013-04-23 MED ORDER — PRENATAL MULTIVITAMIN CH
1.0000 | ORAL_TABLET | Freq: Every day | ORAL | Status: DC
  Administered 2013-04-24 – 2013-04-25 (×2): 1 via ORAL
  Filled 2013-04-23 (×2): qty 1

## 2013-04-23 MED ORDER — PHENYLEPHRINE 40 MCG/ML (10ML) SYRINGE FOR IV PUSH (FOR BLOOD PRESSURE SUPPORT)
80.0000 ug | PREFILLED_SYRINGE | INTRAVENOUS | Status: DC | PRN
  Filled 2013-04-23: qty 2

## 2013-04-23 MED ORDER — FENTANYL 2.5 MCG/ML BUPIVACAINE 1/10 % EPIDURAL INFUSION (WH - ANES)
14.0000 mL/h | INTRAMUSCULAR | Status: DC | PRN
  Administered 2013-04-23: 14 mL/h via EPIDURAL
  Filled 2013-04-23 (×2): qty 125

## 2013-04-23 MED ORDER — ONDANSETRON HCL 4 MG/2ML IJ SOLN
4.0000 mg | Freq: Four times a day (QID) | INTRAMUSCULAR | Status: DC | PRN
  Administered 2013-04-23: 4 mg via INTRAVENOUS
  Filled 2013-04-23: qty 2

## 2013-04-23 MED ORDER — LANOLIN HYDROUS EX OINT: TOPICAL_OINTMENT | CUTANEOUS | Status: DC | PRN

## 2013-04-23 MED ORDER — FLEET ENEMA 7-19 GM/118ML RE ENEM: 1.0000 | ENEMA | Freq: Every day | RECTAL | Status: DC | PRN

## 2013-04-23 MED ORDER — OXYCODONE-ACETAMINOPHEN 5-325 MG PO TABS
1.0000 | ORAL_TABLET | ORAL | Status: DC | PRN
  Administered 2013-04-24 (×2): 1 via ORAL
  Administered 2013-04-25: 2 via ORAL
  Administered 2013-04-25 (×2): 1 via ORAL
  Administered 2013-04-25: 2 via ORAL
  Filled 2013-04-23 (×4): qty 1
  Filled 2013-04-23 (×2): qty 2

## 2013-04-23 MED ORDER — SENNOSIDES-DOCUSATE SODIUM 8.6-50 MG PO TABS
2.0000 | ORAL_TABLET | ORAL | Status: DC
  Administered 2013-04-25: 2 via ORAL

## 2013-04-23 MED ORDER — OXYTOCIN 40 UNITS IN LACTATED RINGERS INFUSION - SIMPLE MED: 62.5000 mL/h | INTRAVENOUS | Status: DC

## 2013-04-23 MED ORDER — BUTORPHANOL TARTRATE 1 MG/ML IJ SOLN
1.0000 mg | Freq: Once | INTRAMUSCULAR | Status: AC
  Administered 2013-04-23: 1 mg via INTRAVENOUS
  Filled 2013-04-23: qty 1

## 2013-04-23 MED ORDER — FLEET ENEMA 7-19 GM/118ML RE ENEM: 1.0000 | ENEMA | RECTAL | Status: DC | PRN

## 2013-04-23 MED ORDER — DIPHENHYDRAMINE HCL 50 MG/ML IJ SOLN: 12.5000 mg | INTRAMUSCULAR | Status: DC | PRN

## 2013-04-23 MED ORDER — TETANUS-DIPHTH-ACELL PERTUSSIS 5-2.5-18.5 LF-MCG/0.5 IM SUSP: 0.5000 mL | Freq: Once | INTRAMUSCULAR | Status: DC

## 2013-04-23 MED ORDER — ACETAMINOPHEN 325 MG PO TABS: 650.0000 mg | ORAL_TABLET | ORAL | Status: DC | PRN

## 2013-04-23 MED ORDER — IBUPROFEN 600 MG PO TABS
600.0000 mg | ORAL_TABLET | Freq: Four times a day (QID) | ORAL | Status: DC
  Administered 2013-04-23 – 2013-04-25 (×9): 600 mg via ORAL
  Filled 2013-04-23 (×9): qty 1

## 2013-04-23 MED ORDER — OXYTOCIN BOLUS FROM INFUSION: 500.0000 mL | INTRAVENOUS | Status: DC

## 2013-04-23 MED ORDER — ZOLPIDEM TARTRATE 5 MG PO TABS: 5.0000 mg | ORAL_TABLET | Freq: Every evening | ORAL | Status: DC | PRN

## 2013-04-23 MED ORDER — ONDANSETRON HCL 4 MG PO TABS: 4.0000 mg | ORAL_TABLET | ORAL | Status: DC | PRN

## 2013-04-23 MED ORDER — DIBUCAINE 1 % RE OINT: 1.0000 "application " | TOPICAL_OINTMENT | RECTAL | Status: DC | PRN

## 2013-04-23 MED ORDER — BENZOCAINE-MENTHOL 20-0.5 % EX AERO
1.0000 "application " | INHALATION_SPRAY | CUTANEOUS | Status: DC | PRN
  Administered 2013-04-23: 1 via TOPICAL
  Filled 2013-04-23: qty 56

## 2013-04-23 MED ORDER — EPHEDRINE 5 MG/ML INJ
10.0000 mg | INTRAVENOUS | Status: DC | PRN
  Filled 2013-04-23: qty 2

## 2013-04-23 MED ORDER — PHENYLEPHRINE 40 MCG/ML (10ML) SYRINGE FOR IV PUSH (FOR BLOOD PRESSURE SUPPORT)
80.0000 ug | PREFILLED_SYRINGE | INTRAVENOUS | Status: DC | PRN
  Filled 2013-04-23: qty 5
  Filled 2013-04-23: qty 2

## 2013-04-23 MED ORDER — WITCH HAZEL-GLYCERIN EX PADS: 1.0000 "application " | MEDICATED_PAD | CUTANEOUS | Status: DC | PRN

## 2013-04-23 MED ORDER — DIPHENHYDRAMINE HCL 25 MG PO CAPS: 25.0000 mg | ORAL_CAPSULE | Freq: Four times a day (QID) | ORAL | Status: DC | PRN

## 2013-04-23 MED ORDER — LIDOCAINE HCL (PF) 1 % IJ SOLN
30.0000 mL | INTRAMUSCULAR | Status: AC | PRN
  Administered 2013-04-23: 30 mL via SUBCUTANEOUS
  Filled 2013-04-23 (×2): qty 30

## 2013-04-23 MED ORDER — SIMETHICONE 80 MG PO CHEW: 80.0000 mg | CHEWABLE_TABLET | ORAL | Status: DC | PRN

## 2013-04-23 MED ORDER — LIDOCAINE HCL (PF) 1 % IJ SOLN
INTRAMUSCULAR | Status: DC | PRN
  Administered 2013-04-23 (×2): 4 mL

## 2013-04-23 MED ORDER — OXYTOCIN 40 UNITS IN LACTATED RINGERS INFUSION - SIMPLE MED
1.0000 m[IU]/min | INTRAVENOUS | Status: DC
  Administered 2013-04-23: 2 m[IU]/min via INTRAVENOUS
  Filled 2013-04-23: qty 1000

## 2013-04-23 MED ORDER — CITRIC ACID-SODIUM CITRATE 334-500 MG/5ML PO SOLN: 30.0000 mL | ORAL | Status: DC | PRN

## 2013-04-23 MED ORDER — LACTATED RINGERS IV SOLN
INTRAVENOUS | Status: DC
  Administered 2013-04-23 (×2): via INTRAVENOUS

## 2013-04-23 MED ORDER — LACTATED RINGERS IV SOLN: 500.0000 mL | INTRAVENOUS | Status: DC | PRN

## 2013-04-23 NOTE — Anesthesia Preprocedure Evaluation (Signed)
Anesthesia Evaluation  Patient identified by MRN, date of birth, ID band Patient awake    Reviewed: Allergy & Precautions, H&P , Patient's Chart, lab work & pertinent test results  Airway Mallampati: II TM Distance: >3 FB Neck ROM: Full    Dental no notable dental hx. (+) Teeth Intact   Pulmonary former smoker,  breath sounds clear to auscultation  Pulmonary exam normal       Cardiovascular negative cardio ROS  Rhythm:Regular Rate:Normal     Neuro/Psych negative neurological ROS  negative psych ROS   GI/Hepatic negative GI ROS, (+) Hepatitis -, C  Endo/Other  negative endocrine ROS  Renal/GU negative Renal ROS  negative genitourinary   Musculoskeletal negative musculoskeletal ROS (+)   Abdominal   Peds  Hematology negative hematology ROS (+)   Anesthesia Other Findings   Reproductive/Obstetrics (+) Pregnancy                           Anesthesia Physical Anesthesia Plan  ASA: II  Anesthesia Plan: Epidural   Post-op Pain Management:    Induction:   Airway Management Planned: Natural Airway  Additional Equipment:   Intra-op Plan:   Post-operative Plan:   Informed Consent: I have reviewed the patients History and Physical, chart, labs and discussed the procedure including the risks, benefits and alternatives for the proposed anesthesia with the patient or authorized representative who has indicated his/her understanding and acceptance.     Plan Discussed with: Anesthesiologist  Anesthesia Plan Comments:         Anesthesia Quick Evaluation

## 2013-04-23 NOTE — Anesthesia Procedure Notes (Signed)
Epidural Patient location during procedure: OB Start time: 04/23/2013 3:13 AM  Staffing Anesthesiologist: Reino Lybbert A. Performed by: anesthesiologist   Preanesthetic Checklist Completed: patient identified, site marked, surgical consent, pre-op evaluation, timeout performed, IV checked, risks and benefits discussed and monitors and equipment checked  Epidural Patient position: sitting Prep: site prepped and draped and DuraPrep Patient monitoring: continuous pulse ox and blood pressure Approach: midline Injection technique: LOR air  Needle:  Needle type: Tuohy  Needle gauge: 17 G Needle length: 9 cm and 9 Needle insertion depth: 5 cm cm Catheter type: closed end flexible Catheter size: 19 Gauge Catheter at skin depth: 10 cm Test dose: negative and Other  Assessment Events: blood not aspirated, injection not painful, no injection resistance, negative IV test and no paresthesia  Additional Notes Patient identified. Risks and benefits discussed including failed block, incomplete  Pain control, post dural puncture headache, nerve damage, paralysis, blood pressure Changes, nausea, vomiting, reactions to medications-both toxic and allergic and post Partum back pain. All questions were answered. Patient expressed understanding and wished to proceed. Sterile technique was used throughout procedure. Epidural site was Dressed with sterile barrier dressing. No paresthesias, signs of intravascular injection Or signs of intrathecal spread were encountered.  Patient was more comfortable after the epidural was dosed. Please see RN's note for documentation of vital signs and FHR which are stable.

## 2013-04-23 NOTE — Progress Notes (Signed)
Patient ID: Tiffany Butler, female   DOB: 20-May-1986, 27 y.o.   MRN: [id number] Completely dilated fhr cat one Begin second stage

## 2013-04-23 NOTE — Progress Notes (Signed)
Pt requesting an epidural but anesthesia is tied up in the or.  Discussed iv pain medication options with dr Vincente Poli, mentioning pts previous opioid addiction.  Dr Vincente Poli gives order for 1mg  stadol iv

## 2013-04-23 NOTE — MAU Note (Signed)
Pt reprots she started having ctx at 7pm now ctxs are stronger and cloere about 4-5 min reports loosing mucus plug and having some bloody show . Good fetal movement reported.

## 2013-04-23 NOTE — H&P (Signed)
Tiffany Butler is a 27 y.o. female presenting at 40+ weeks with spontaneous onset of labor and rupture of membranes. Her group B strep is negative. Prenatal complications are as noted below. Maternal Medical History:  Reason for admission: Rupture of membranes and contractions.   Contractions: Onset was 3-5 hours ago.   Frequency: regular.   Perceived severity is moderate.    Fetal activity: Perceived fetal activity is normal.    Prenatal complications: Prenatal course is complicated by a history of opiate addiction. Did have use in the first trimester. Ultrasounds have been normal. Patient also has a history of hepatitis C with a large viral load. She does have elevated liver function tests related to this. MFM consultation was obtained. They did not recommend a cesarean section. They stated the risk of transmission to the baby's approximately 2% patient also a history of genital herpes. Did have an outbreak early pregnancy. Has been on Valtrex suppression. Right now she denies any active lesions or prodromal symptoms.  Prenatal Complications - Diabetes: none.    OB History   Grav Para Term Preterm Abortions TAB SAB Ect Mult Living   2 0 0 0 1 1 0 0 0 0      Past Medical History  Diagnosis Date  . Hepatitis C antibody test positive   . HSV (herpes simplex virus) infection   . Opioid abuse     hx of   Past Surgical History  Procedure Laterality Date  . No past surgeries     Family History: family history is not on file. Social History:  reports that she has quit smoking. She does not have any smokeless tobacco history on file. She reports that she does not drink alcohol or use illicit drugs.   Prenatal Transfer Tool  Maternal Diabetes: No Genetic Screening: Normal Maternal Ultrasounds/Referrals: Normal Fetal Ultrasounds or other Referrals:  None Maternal Substance Abuse:  Yes:  Type: Other: opiates Significant Maternal Medications:  Meds include: Other: opioids in early  pregnancy Significant Maternal Lab Results:  Lab values include: Group B Strep negative, Other: hepatitis c with large viral load Other Comments:  patient also with history of genital herpes  ROS  Dilation: 3 Effacement (%): 90 Station: -2 Exam by:: foley,rn Blood pressure 105/47, pulse 75, temperature 98.8 F (37.1 C), temperature source Oral, resp. rate 20, height 5\' 5"  (1.651 m), weight 78.472 kg (173 lb), SpO2 100.00%. Maternal Exam:  Uterine Assessment: Contraction strength is moderate.  Contraction frequency is irregular.   Abdomen: Patient reports no abdominal tenderness. Fundal height is c/w dates.   Estimated fetal weight is 7.   Fetal presentation: vertex  Introitus: Amniotic fluid character: clear.  Pelvis: adequate for delivery.   Cervix: Cervix evaluated by digital exam.   6+ and 80 %  vtx at -1   no evidence of herpetic lesions Physical Exam  Prenatal labs: ABO, Rh: B/Positive/-- (03/03 0000) Antibody: Negative (03/03 0000) Rubella: Immune (03/03 0000) RPR: Nonreactive (03/03 0000)  HBsAg: Negative (03/03 0000)  HIV: Non-reactive (03/03 0000)  GBS: Negative (08/20 0000)   Assessment/Plan: IUP at term with SOL Carrier of hepatitis C History of genital herpes  Routine labor and delivery risk of pitocin discussed  Tiffany Butler S 04/23/2013, 7:53 AM

## 2013-04-24 LAB — CBC
HCT: 26.9 % — ABNORMAL LOW (ref 36.0–46.0)
Platelets: 203 10*3/uL (ref 150–400)
RDW: 13.3 % (ref 11.5–15.5)
WBC: 12.4 10*3/uL — ABNORMAL HIGH (ref 4.0–10.5)

## 2013-04-24 NOTE — Lactation Note (Signed)
This note was copied from the chart of [name]. Lactation Consultation Note     Follow up consult with this mom and baby. Mom had just finished breast feeding. She reports he is latching well. Her nipples are slightly sore in the beginning of a feed, but with "adjustments" , she is comfortable. On exam, mom's left areola has a pink area from what appears to be an off center latch. I showed mom how to hand express and apply EBM to her nipples. Dad holding baby, asleep and content. Mom knows to call for help with next feeding, as needed, or for questions/concerns.  Patient Name: [name] Date: 04/24/2013 Reason for consult: Follow-up assessment   Maternal Data    Feeding Feeding Type: Breast Milk Length of feed: 15 min  LATCH Score/Interventions Latch: Grasps breast easily, tongue down, lips flanged, rhythmical sucking.  Audible Swallowing: A few with stimulation Intervention(s): Skin to skin  Type of Nipple: Everted at rest and after stimulation  Comfort (Breast/Nipple): Soft / non-tender     Hold (Positioning): No assistance needed to correctly position infant at breast.  LATCH Score: 9  Lactation Tools Discussed/Used     Consult Status Consult Status: Follow-up Follow-up type: Call as needed    Alfred Levins 04/24/2013, 1:27 PM

## 2013-04-24 NOTE — Lactation Note (Signed)
This note was copied from the chart of Tiffany Butler. Lactation Consultation Note  Patient Name: Tiffany Butler ZOXWR'U Date: 04/24/2013   [facility] had already seen this mom and baby today and noted some slight nipple soreness with recommendation to apply expressed milk and ensure deep latch.  RN, Eunice Blase recommends trying some comfort gelpads tonight for additional healing and comfort, so LC provided these to try.  Maternal Data    Feeding Feeding Type: Breast Milk Length of feed: 10 min  LATCH Score/Interventions Latch: Grasps breast easily, tongue down, lips flanged, rhythmical sucking.  Audible Swallowing: A few with stimulation Intervention(s): Skin to skin;Hand expression;Alternate breast massage  Type of Nipple: Everted at rest and after stimulation  Comfort (Breast/Nipple): Filling, red/small blisters or bruises, mild/mod discomfort  Problem noted: Mild/Moderate discomfort Interventions (Mild/moderate discomfort): Hand massage;Comfort gels  Hold (Positioning): Assistance needed to correctly position infant at breast and maintain latch.  LATCH Score: 7  (previous feeding assessment)  Lactation Tools Discussed/Used   Comfort gelpads (RN to provide and instruct in use)  Consult Status   LC follow-up tomorrow   Lynda Rainwater 04/24/2013, 8:04 PM

## 2013-04-24 NOTE — Progress Notes (Signed)
Post Partum Day 1 Subjective: no complaints, up ad lib, voiding and tolerating PO  Objective: Blood pressure 136/82, pulse 80, temperature 97 F (36.1 C), temperature source Oral, resp. rate 18, height 5\' 5"  (1.651 m), weight 173 lb (78.472 kg), SpO2 100.00%, unknown if currently breastfeeding.  Physical Exam:  General: alert and cooperative Lochia: appropriate Uterine Fundus: firm Incision: perineum intact DVT Evaluation: No evidence of DVT seen on physical exam. Negative Homan's sign. No cords or calf tenderness. No significant calf/ankle edema.   Recent Labs  04/23/13 0210 04/24/13 0555  HGB 12.6 9.5*  HCT 35.8* 26.9*    Assessment/Plan: Plan for discharge tomorrow   LOS: 2 days   Elycia Woodside G 04/24/2013, 8:25 AM

## 2013-04-24 NOTE — Anesthesia Postprocedure Evaluation (Signed)
  Anesthesia Post-op Note  Patient: Tiffany Butler  Procedure(s) Performed: * No procedures listed *  Patient Location: Mother/Baby  Anesthesia Type:Epidural  Level of Consciousness: awake  Airway and Oxygen Therapy: Patient Spontanous Breathing  Post-op Pain: none  Post-op Assessment: Patient's Cardiovascular Status Stable, Respiratory Function Stable, Patent Airway, No signs of Nausea or vomiting, Adequate PO intake, Pain level controlled, No headache, No backache, No residual numbness and No residual motor weakness  Post-op Vital Signs: Reviewed and stable  Complications: No apparent anesthesia complications

## 2013-04-25 MED ORDER — OXYCODONE-ACETAMINOPHEN 5-325 MG PO TABS: 1.0000 | ORAL_TABLET | ORAL | Status: DC | PRN

## 2013-04-25 MED ORDER — IBUPROFEN 600 MG PO TABS: 600.0000 mg | ORAL_TABLET | Freq: Four times a day (QID) | ORAL | Status: DC

## 2013-04-25 NOTE — Discharge Summary (Signed)
Obstetric Discharge Summary Reason for Admission: onset of labor and rupture of membranes Prenatal Procedures: ultrasound Intrapartum Procedures: spontaneous vaginal delivery Postpartum Procedures: none Complications-Operative and Postpartum: 2 degree perineal laceration Hemoglobin  Date Value Range Status  04/24/2013 9.5* 12.0 - 15.0 g/dL Final     DELTA CHECK NOTED     REPEATED TO VERIFY     HCT  Date Value Range Status  04/24/2013 26.9* 36.0 - 46.0 % Final    Physical Exam:  General: alert and cooperative Lochia: appropriate Uterine Fundus: firm Incision: healing well DVT Evaluation: No evidence of DVT seen on physical exam. Negative Homan's sign. No cords or calf tenderness. No significant calf/ankle edema.  Discharge Diagnoses: Term Pregnancy-delivered  Discharge Information: Date: 04/25/2013 Activity: pelvic rest Diet: routine Medications: PNV, Ibuprofen and Percocet Condition: stable Instructions: refer to practice specific booklet Discharge to: home   Newborn Data: Live born female  Birth Weight: 8 lb 3 oz (3714 g) APGAR: 9, 9  Home with mother.  CURTIS,CAROL G 04/25/2013, 8:28 AM

## 2013-04-25 NOTE — Clinical Social Work Maternal (Signed)
    LATE ENTRY FROM 04/24/13:  Clinical Social Work Department PSYCHOSOCIAL ASSESSMENT - MATERNAL/CHILD 04/25/2013  Patient:  Tiffany Butler, Tiffany Butler  Account Number:  [id number]  Admit Date:  04/22/2013  Tiffany Butler Name:   Baker Janus    Clinical Social Worker:  Nobie Putnam, LCSW   Date/Time:  04/24/2013 09:17 AM  Date Referred:  04/24/2013   Referral source  CN     Referred reason  Substance Abuse   Other referral source:    I:  FAMILY / HOME ENVIRONMENT Child's legal guardian:  PARENT  Guardian - Name Guardian - Age Guardian - Address  Tiffany Butler 27 [address] [place], [place] [address]  Tiffany Butler [phone] (same as above)   Other household support members/support persons Other support:    II  PSYCHOSOCIAL DATA Information Source:  Patient Interview  Insurance claims handler Resources Employment:   Archivist resources:  Media planner If [employer] - [place]:    School / Grade:   Maternity Care Coordinator / Child Services Coordination / Early Interventions:  Cultural issues impacting care:    III  STRENGTHS Strengths  Adequate Resources  Home prepared for Child (including basic supplies)  Supportive family/friends   Strength comment:    IV  RISK FACTORS AND CURRENT PROBLEMS Current Problem:     Risk Factor & Current Problem Patient Issue Family Issue Risk Factor / Current Problem Comment  Substance Abuse Y N Hx of addiction    V  SOCIAL WORK ASSESSMENT CSW met with pt to assess history of extensive substance abuse history noted in chart.  Pt told CSW that she started abusing drugs at age 35.  She identified heroin & pain pills, as her drugs of choice.  She admits to abusing substances "on & off" since she was 27 years old, with some periods of sobriety.  She received substance abuse treatment at [facility] in 11/04 & [place], in [place]. [place] [place] in 7/13.  Initially, pt told CSW that she had not used in 1 1/2 year although  pt's chart notes relapse in 12/14.  Pt admitted to CSW that she relapsed in 12/14 on alcohol & pain pill (snorting).  She denies any heroin use at that time.  She denies any illegal substance use since then & verbalized understanding of hospital drug testing policy.  UDS is negative, meconium results are pending.  Pt attends NA meeting at least 3-4 times a week for support.  FOB was at the bedside, aware of her history & supportive.  Pt denies history of depression or SI.  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  She is expects to graduate this year. Pt has good family support & appears to be bonding well with the infant.  CSW will continue to monitor drug screen results & make a referral if needed.      VI SOCIAL WORK PLAN Social Work Plan  No Further Intervention Required / No Barriers to Discharge   Type of pt/family education:   If child protective services report - county:   If child protective services report - date:   Information/referral to community resources comment:   Other social work plan:

## 2013-04-26 ENCOUNTER — Ambulatory Visit: Payer: Self-pay

## 2013-04-26 NOTE — Lactation Note (Signed)
This note was copied from the chart of Tiffany Jarissa Sheriff. Lactation Consultation Note: Follow up visit with mom. She reports that baby has just nursed for 10 minutes on the right breast. Reports that breasts are feeling much heavier this morning. Discussed engorgement prevention and treatment. Encouraged to continue trying to latch baby without the nipple shield. Attempted latch to left breast without the nipple shield and he did take a few sucks but then off to sleep. No further questions at present. OP appointment made with [place] for Thursday. To call prn  Patient Name: Tiffany Butler ZOXWR'U Date: 04/26/2013 Reason for consult: Follow-up assessment   Maternal Data    Feeding Feeding Type: Breast Milk Length of feed: 10 min  LATCH Score/Interventions Latch: Too sleepy or reluctant, no latch achieved, no sucking elicited.  Audible Swallowing: None  Type of Nipple: Everted at rest and after stimulation  Comfort (Breast/Nipple): Soft / non-tender  Problem noted: Filling  Hold (Positioning): Assistance needed to correctly position infant at breast and maintain latch. Intervention(s): Breastfeeding basics reviewed;Support Pillows;Position options  LATCH Score: 5  Lactation Tools Discussed/Used Tools: Nipple Shields   Consult Status Consult Status: Follow-up Date: 05/01/13 Follow-up type: Out-patient    Pamelia Hoit 04/26/2013, 8:51 AM

## 2013-04-29 ENCOUNTER — Inpatient Hospital Stay (HOSPITAL_COMMUNITY): Admission: RE | Admit: 2013-04-29 | Payer: BC Managed Care – PPO | Source: Ambulatory Visit

## 2014-06-08 ENCOUNTER — Encounter (HOSPITAL_COMMUNITY): Payer: Self-pay | Admitting: *Deleted

## 2015-07-10 ENCOUNTER — Ambulatory Visit (INDEPENDENT_AMBULATORY_CARE_PROVIDER_SITE_OTHER): Payer: Self-pay | Admitting: Family Medicine

## 2015-07-10 VITALS — BP 120/80 | HR 76 | Temp 98.5°F | Resp 17 | Ht 66.0 in | Wt 148.8 lb

## 2015-07-10 DIAGNOSIS — L02212 Cutaneous abscess of back [any part, except buttock]: Secondary | ICD-10-CM

## 2015-07-10 MED ORDER — SULFAMETHOXAZOLE-TRIMETHOPRIM 800-160 MG PO TABS: 1.0000 | ORAL_TABLET | Freq: Two times a day (BID) | ORAL | Status: DC

## 2015-07-10 NOTE — Patient Instructions (Signed)
Dress the wound with a little Polysporin  Take Bactrim (sulfamethoxazole) one twice daily  Clean it in the shower once or twice a day for the next few days  Return if problems

## 2015-07-10 NOTE — Progress Notes (Signed)
Patient ID: Tiffany Butler, female    DOB: [birth year]  Age: 29 y.o. MRN: [id number]  Chief Complaint  Patient presents with  . Recurrent Skin Infections    cyst in  mid-back    Subjective:   About 4 days the patient is been aware that she was getting a little area of infection on her mid back. The last 2 days it has been using. She came on in today to get it checked. She has had skin abscesses in the past. Nothing recently.  Current allergies, medications, problem list, past/family and social histories reviewed.  Objective:  BP 120/80 mmHg  Pulse 76  Temp(Src) 98.5 F (36.9 C) (Oral)  Resp 17  Ht 5\' 6"  (1.676 m)  Wt 148 lb 12.8 oz (67.495 kg)  BMI 24.03 kg/m2  SpO2 99%  LMP 07/05/2015  Breastfeeding? No  2-3 cm area of erythema overlying her spine at about the T10 level. It has a central cavity which is filled with a very thick mucus debris. This was debrided without difficulty with gauze and then with pickups.  Tolerated procedure well. She did not cry or any I&D. No pus could be expressed.  Assessment & Plan:   Assessment: 1. Cutaneous abscess of back excluding buttocks       Plan: This abscess is very drained. It is wide open. It appears fairly superficial. Will treat with antibiotics and local care. Culture taken.  Orders Placed This Encounter  Procedures  . Wound culture    Meds ordered this encounter  Medications  . norethindrone-ethinyl estradiol (JUNEL FE,GILDESS FE,LOESTRIN FE) 1-20 MG-MCG tablet    Sig: Take 1 tablet by mouth daily.  . valACYclovir (VALTREX) 1000 MG tablet    Sig: Take 1,000 mg by mouth 2 (two) times daily.  Marland Kitchen. sulfamethoxazole-trimethoprim (BACTRIM DS,SEPTRA DS) 800-160 MG tablet    Sig: Take 1 tablet by mouth 2 (two) times daily.    Dispense:  20 tablet    Refill:  0         Patient Instructions  Dress the wound with a little Polysporin  Take Bactrim (sulfamethoxazole) one twice daily  Clean it in the shower once or twice a  day for the next few days  Return if problems     Return if symptoms worsen or fail to improve.   Caeli Linehan, MD 07/10/2015

## 2015-07-13 LAB — WOUND CULTURE: Gram Stain: NONE SEEN

## 2017-03-16 ENCOUNTER — Encounter: Payer: Self-pay | Admitting: Physician Assistant

## 2017-03-16 ENCOUNTER — Ambulatory Visit (INDEPENDENT_AMBULATORY_CARE_PROVIDER_SITE_OTHER): Payer: Managed Care, Other (non HMO) | Admitting: Physician Assistant

## 2017-03-16 VITALS — BP 103/63 | HR 70 | Temp 98.7°F | Resp 16 | Ht 66.0 in | Wt 143.0 lb

## 2017-03-16 DIAGNOSIS — Z20818 Contact with and (suspected) exposure to other bacterial communicable diseases: Secondary | ICD-10-CM

## 2017-03-16 DIAGNOSIS — J029 Acute pharyngitis, unspecified: Secondary | ICD-10-CM

## 2017-03-16 LAB — POCT RAPID STREP A (OFFICE): RAPID STREP A SCREEN: NEGATIVE

## 2017-03-16 MED ORDER — LIDOCAINE VISCOUS 2 % MT SOLN: 5.0000 mL | OROMUCOSAL | 0 refills | Status: DC | PRN

## 2017-03-16 MED ORDER — AMOXICILLIN 875 MG PO TABS: 875.0000 mg | ORAL_TABLET | Freq: Two times a day (BID) | ORAL | 0 refills | Status: DC

## 2017-03-16 NOTE — Patient Instructions (Signed)
     IF you received an x-ray today, you will receive an invoice from [facility]. Please contact [facility] at [phone] with questions or concerns regarding your invoice.   IF you received labwork today, you will receive an invoice from [facility]. Please contact [facility] at [phone] with questions or concerns regarding your invoice.   Our billing staff will not be able to assist you with questions regarding bills from these companies.  You will be contacted with the lab results as soon as they are available. The fastest way to get your results is to activate your My Chart account. Instructions are located on the last page of this paperwork. If you have not heard from us regarding the results in 2 weeks, please contact this office.     

## 2017-03-16 NOTE — Progress Notes (Signed)
    03/18/2017 9:51 AM   DOB: 1986-07-19 / MRN: [id number]  SUBJECTIVE:  Tiffany Butler is a 31 y.o. female presenting for exposure to strep throat via her 843 year old son who had a possitive rapid strep yesterday at the peds office. Pt's pain started today. No cough, nasal congestion.  Assoicates HA.   She has No Known Allergies.   She  has a past medical history of Hepatitis C antibody test positive; HSV (herpes simplex virus) infection; and Opioid abuse.    She  reports that she has quit smoking. She has never used smokeless tobacco. She reports that she does not drink alcohol or use drugs. She  reports that she currently engages in sexual activity. The patient  has a past surgical history that includes No past surgeries.  Her family history is not on file.  Review of Systems  Constitutional: Negative for chills, diaphoresis and fever.  Gastrointestinal: Negative for nausea.  Skin: Negative for rash.  Neurological: Negative for dizziness.    The problem list and medications were reviewed and updated by myself where necessary and exist elsewhere in the encounter.   OBJECTIVE:  BP 103/63   Pulse 70   Temp 98.7 F (37.1 C) (Oral)   Resp 16   Ht 5\' 6"  (1.676 m)   Wt 143 lb (64.9 kg)   SpO2 100%   BMI 23.08 kg/m   Physical Exam  Constitutional: She is oriented to person, place, and time. She appears well-developed and well-nourished. No distress.  HENT:  Mouth/Throat: No oropharyngeal exudate.  Eyes: Conjunctivae are normal.  Cardiovascular: Normal rate and regular rhythm.   Pulmonary/Chest: Effort normal and breath sounds normal.  Abdominal: Soft. Bowel sounds are normal.  Musculoskeletal: Normal range of motion.  Lymphadenopathy:       Head (right side): Tonsillar adenopathy present.       Head (left side): Tonsillar adenopathy present.  Neurological: She is alert and oriented to person, place, and time. She has normal reflexes. She displays normal reflexes. No cranial  nerve deficit. She exhibits normal muscle tone. Coordination normal.  Skin: Skin is warm and dry. She is not diaphoretic.    Results for orders placed or performed in visit on 03/16/17 (from the past 72 hour(s))  Rapid Strep A     Status: None   Collection Time: 03/16/17  6:04 PM  Result Value Ref Range   Rapid Strep A Screen Negative Negative    No results found.  ASSESSMENT AND PLAN:  Tiffany Butler was seen today for sore throat and headache.  Diagnoses and all orders for this visit:  Exposure to strep throat  Sore throat: Exposed to her some with a positive rapid three days ago.  Will go ahead and treat call ASAP if culture is negative.  -     amoxicillin (AMOXIL) 875 MG tablet; Take 1 tablet (875 mg total) by mouth 2 (two) times daily. -     lidocaine (XYLOCAINE) 2 % solution; Use as directed 5-10 mLs in the mouth or throat as needed for mouth pain. -     Rapid Strep A -     Culture, Group A Strep    The patient is advised to call or return to clinic if she does not see an improvement in symptoms, or to seek the care of the closest emergency department if she worsens with the above plan.   Deliah BostonMichael Clark, MHS, PA-C [facility] 03/18/2017 9:51 AM

## 2017-03-19 LAB — CULTURE, GROUP A STREP

## 2017-08-30 DIAGNOSIS — M9902 Segmental and somatic dysfunction of thoracic region: Secondary | ICD-10-CM | POA: Diagnosis not present

## 2017-08-30 DIAGNOSIS — M9901 Segmental and somatic dysfunction of cervical region: Secondary | ICD-10-CM | POA: Diagnosis not present

## 2017-08-30 DIAGNOSIS — M5384 Other specified dorsopathies, thoracic region: Secondary | ICD-10-CM | POA: Diagnosis not present

## 2017-08-30 DIAGNOSIS — M5382 Other specified dorsopathies, cervical region: Secondary | ICD-10-CM | POA: Diagnosis not present

## 2017-09-06 DIAGNOSIS — Z3041 Encounter for surveillance of contraceptive pills: Secondary | ICD-10-CM | POA: Diagnosis not present

## 2017-09-06 DIAGNOSIS — Z6824 Body mass index (BMI) 24.0-24.9, adult: Secondary | ICD-10-CM | POA: Diagnosis not present

## 2017-09-06 DIAGNOSIS — Z309 Encounter for contraceptive management, unspecified: Secondary | ICD-10-CM | POA: Diagnosis not present

## 2017-09-06 DIAGNOSIS — Z0000 Encounter for general adult medical examination without abnormal findings: Secondary | ICD-10-CM | POA: Diagnosis not present

## 2017-09-11 DIAGNOSIS — M9902 Segmental and somatic dysfunction of thoracic region: Secondary | ICD-10-CM | POA: Diagnosis not present

## 2017-09-11 DIAGNOSIS — M5382 Other specified dorsopathies, cervical region: Secondary | ICD-10-CM | POA: Diagnosis not present

## 2017-09-11 DIAGNOSIS — M5384 Other specified dorsopathies, thoracic region: Secondary | ICD-10-CM | POA: Diagnosis not present

## 2017-09-11 DIAGNOSIS — M9901 Segmental and somatic dysfunction of cervical region: Secondary | ICD-10-CM | POA: Diagnosis not present

## 2017-09-14 DIAGNOSIS — M9901 Segmental and somatic dysfunction of cervical region: Secondary | ICD-10-CM | POA: Diagnosis not present

## 2017-09-14 DIAGNOSIS — M5384 Other specified dorsopathies, thoracic region: Secondary | ICD-10-CM | POA: Diagnosis not present

## 2017-09-14 DIAGNOSIS — M5382 Other specified dorsopathies, cervical region: Secondary | ICD-10-CM | POA: Diagnosis not present

## 2017-09-14 DIAGNOSIS — M9902 Segmental and somatic dysfunction of thoracic region: Secondary | ICD-10-CM | POA: Diagnosis not present

## 2017-09-21 DIAGNOSIS — M9902 Segmental and somatic dysfunction of thoracic region: Secondary | ICD-10-CM | POA: Diagnosis not present

## 2017-09-21 DIAGNOSIS — M5382 Other specified dorsopathies, cervical region: Secondary | ICD-10-CM | POA: Diagnosis not present

## 2017-09-21 DIAGNOSIS — M5384 Other specified dorsopathies, thoracic region: Secondary | ICD-10-CM | POA: Diagnosis not present

## 2017-09-21 DIAGNOSIS — M9901 Segmental and somatic dysfunction of cervical region: Secondary | ICD-10-CM | POA: Diagnosis not present

## 2017-09-24 DIAGNOSIS — M9901 Segmental and somatic dysfunction of cervical region: Secondary | ICD-10-CM | POA: Diagnosis not present

## 2017-09-24 DIAGNOSIS — M9902 Segmental and somatic dysfunction of thoracic region: Secondary | ICD-10-CM | POA: Diagnosis not present

## 2017-09-24 DIAGNOSIS — M5382 Other specified dorsopathies, cervical region: Secondary | ICD-10-CM | POA: Diagnosis not present

## 2017-09-24 DIAGNOSIS — M5384 Other specified dorsopathies, thoracic region: Secondary | ICD-10-CM | POA: Diagnosis not present

## 2017-10-01 DIAGNOSIS — M9901 Segmental and somatic dysfunction of cervical region: Secondary | ICD-10-CM | POA: Diagnosis not present

## 2017-10-01 DIAGNOSIS — M5384 Other specified dorsopathies, thoracic region: Secondary | ICD-10-CM | POA: Diagnosis not present

## 2017-10-01 DIAGNOSIS — M9902 Segmental and somatic dysfunction of thoracic region: Secondary | ICD-10-CM | POA: Diagnosis not present

## 2017-10-01 DIAGNOSIS — M5382 Other specified dorsopathies, cervical region: Secondary | ICD-10-CM | POA: Diagnosis not present

## 2017-10-11 DIAGNOSIS — M5384 Other specified dorsopathies, thoracic region: Secondary | ICD-10-CM | POA: Diagnosis not present

## 2017-10-11 DIAGNOSIS — M9901 Segmental and somatic dysfunction of cervical region: Secondary | ICD-10-CM | POA: Diagnosis not present

## 2017-10-11 DIAGNOSIS — M5382 Other specified dorsopathies, cervical region: Secondary | ICD-10-CM | POA: Diagnosis not present

## 2017-10-11 DIAGNOSIS — M9902 Segmental and somatic dysfunction of thoracic region: Secondary | ICD-10-CM | POA: Diagnosis not present

## 2017-10-22 DIAGNOSIS — M5384 Other specified dorsopathies, thoracic region: Secondary | ICD-10-CM | POA: Diagnosis not present

## 2017-10-22 DIAGNOSIS — M5382 Other specified dorsopathies, cervical region: Secondary | ICD-10-CM | POA: Diagnosis not present

## 2017-10-22 DIAGNOSIS — M9902 Segmental and somatic dysfunction of thoracic region: Secondary | ICD-10-CM | POA: Diagnosis not present

## 2017-10-22 DIAGNOSIS — M9901 Segmental and somatic dysfunction of cervical region: Secondary | ICD-10-CM | POA: Diagnosis not present

## 2017-11-01 DIAGNOSIS — M9901 Segmental and somatic dysfunction of cervical region: Secondary | ICD-10-CM | POA: Diagnosis not present

## 2017-11-01 DIAGNOSIS — M5384 Other specified dorsopathies, thoracic region: Secondary | ICD-10-CM | POA: Diagnosis not present

## 2017-11-01 DIAGNOSIS — M5382 Other specified dorsopathies, cervical region: Secondary | ICD-10-CM | POA: Diagnosis not present

## 2017-11-01 DIAGNOSIS — M9902 Segmental and somatic dysfunction of thoracic region: Secondary | ICD-10-CM | POA: Diagnosis not present

## 2017-11-08 DIAGNOSIS — M5384 Other specified dorsopathies, thoracic region: Secondary | ICD-10-CM | POA: Diagnosis not present

## 2017-11-08 DIAGNOSIS — M5382 Other specified dorsopathies, cervical region: Secondary | ICD-10-CM | POA: Diagnosis not present

## 2017-11-08 DIAGNOSIS — M9902 Segmental and somatic dysfunction of thoracic region: Secondary | ICD-10-CM | POA: Diagnosis not present

## 2017-11-08 DIAGNOSIS — M9901 Segmental and somatic dysfunction of cervical region: Secondary | ICD-10-CM | POA: Diagnosis not present

## 2017-11-15 DIAGNOSIS — M9901 Segmental and somatic dysfunction of cervical region: Secondary | ICD-10-CM | POA: Diagnosis not present

## 2017-11-15 DIAGNOSIS — M5382 Other specified dorsopathies, cervical region: Secondary | ICD-10-CM | POA: Diagnosis not present

## 2017-11-15 DIAGNOSIS — M5384 Other specified dorsopathies, thoracic region: Secondary | ICD-10-CM | POA: Diagnosis not present

## 2017-11-15 DIAGNOSIS — M9902 Segmental and somatic dysfunction of thoracic region: Secondary | ICD-10-CM | POA: Diagnosis not present

## 2017-11-30 DIAGNOSIS — M5384 Other specified dorsopathies, thoracic region: Secondary | ICD-10-CM | POA: Diagnosis not present

## 2017-11-30 DIAGNOSIS — M9901 Segmental and somatic dysfunction of cervical region: Secondary | ICD-10-CM | POA: Diagnosis not present

## 2017-11-30 DIAGNOSIS — M5382 Other specified dorsopathies, cervical region: Secondary | ICD-10-CM | POA: Diagnosis not present

## 2017-11-30 DIAGNOSIS — M9902 Segmental and somatic dysfunction of thoracic region: Secondary | ICD-10-CM | POA: Diagnosis not present

## 2018-01-31 ENCOUNTER — Ambulatory Visit: Payer: BLUE CROSS/BLUE SHIELD | Admitting: Physician Assistant

## 2018-01-31 ENCOUNTER — Encounter: Payer: Self-pay | Admitting: Physician Assistant

## 2018-01-31 VITALS — BP 90/60 | HR 66 | Temp 98.0°F | Resp 16 | Ht 66.5 in | Wt 144.4 lb

## 2018-01-31 DIAGNOSIS — R1013 Epigastric pain: Secondary | ICD-10-CM

## 2018-01-31 DIAGNOSIS — B1920 Unspecified viral hepatitis C without hepatic coma: Secondary | ICD-10-CM

## 2018-01-31 MED ORDER — PANTOPRAZOLE SODIUM 40 MG PO TBEC: 40.0000 mg | DELAYED_RELEASE_TABLET | Freq: Every day | ORAL | 0 refills | Status: DC

## 2018-01-31 MED ORDER — FAMOTIDINE 40 MG PO TABS: 40.0000 mg | ORAL_TABLET | Freq: Every day | ORAL | 3 refills | Status: DC

## 2018-01-31 NOTE — Progress Notes (Signed)
02/01/2018 1:43 PM   DOB: 1985/11/26 / MRN: [id number]  SUBJECTIVE:  Tiffany Butler is a pleasant 32 y.o. female presenting for upper abdominal pain. Symptoms present for about 4 to 5 days.  The problem is not improving. She has tried diet modification via brat diet.  She did take a pregnancy test and that was negative a few days ago.  She denies nausea.  She denies bloody stools and dizziness.  Patient has a history of hepatitis C and is status post treatment.  Tells me that she failed to present for her final hep C she is requesting.  This is because of [facility].  She is requesting a hep C test today.  She has No Known Allergies.   She  has a past medical history of Hepatitis C antibody test positive, HSV (herpes simplex virus) infection, and Opioid abuse (HCC).    She  reports that she has quit smoking. She has never used smokeless tobacco. She reports that she does not drink alcohol or use drugs. She  reports that she currently engages in sexual activity. The patient  has a past surgical history that includes No past surgeries.  Her family history is not on file.  Review of Systems  Constitutional: Negative for fever.  Respiratory: Negative for cough and shortness of breath.   Gastrointestinal: Positive for abdominal pain. Negative for blood in stool, constipation, diarrhea, heartburn, melena, nausea and vomiting.    The problem list and medications were reviewed and updated by myself where necessary and exist elsewhere in the encounter.   OBJECTIVE:  BP 90/60 (BP Location: Left Arm, Patient Position: Sitting, Cuff Size: Normal)   Pulse 66   Temp 98 F (36.7 C) (Oral)   Resp 16   Ht 5' 6.5" (1.689 m)   Wt 144 lb 6.4 oz (65.5 kg)   SpO2 100%   BMI 22.96 kg/m   Wt Readings from Last 3 Encounters:  01/31/18 144 lb 6.4 oz (65.5 kg)  03/16/17 143 lb (64.9 kg)  07/10/15 148 lb 12.8 oz (67.5 kg)   Temp Readings from Last 3 Encounters:  01/31/18 98 F (36.7 C)  (Oral)  03/16/17 98.7 F (37.1 C) (Oral)  07/10/15 98.5 F (36.9 C) (Oral)   BP Readings from Last 3 Encounters:  01/31/18 90/60  03/16/17 103/63  07/10/15 120/80   Pulse Readings from Last 3 Encounters:  01/31/18 66  03/16/17 70  07/10/15 76    Physical Exam  Constitutional: She is oriented to person, place, and time. She appears well-nourished.  Non-toxic appearance. No distress.  Eyes: Pupils are equal, round, and reactive to light. EOM are normal.  Cardiovascular: Normal rate, regular rhythm, S1 normal, S2 normal, normal heart sounds and intact distal pulses. Exam reveals no gallop, no friction rub and no decreased pulses.  No murmur heard. Pulmonary/Chest: Effort normal. No stridor. No respiratory distress. She has no wheezes. She has no rales.  Abdominal: Normal appearance and bowel sounds are normal. She exhibits no shifting dullness, no distension, no pulsatile liver, no fluid wave, no abdominal bruit, no ascites, no pulsatile midline mass and no mass. There is no hepatosplenomegaly, splenomegaly or hepatomegaly. There is no tenderness. There is no rigidity, no rebound, no guarding, no CVA tenderness, no tenderness at McBurney's point and negative Murphy's sign. No hernia.  Musculoskeletal: She exhibits no edema.  Neurological: She is alert and oriented to person, place, and time. No cranial nerve deficit. Gait normal.  Skin: Skin  is warm and dry. She is not diaphoretic. No pallor.  Psychiatric: She has a normal mood and affect.  Vitals reviewed.     Lab Results  Component Value Date   WBC 4.1 01/31/2018   HGB 11.8 01/31/2018   HCT 35.0 01/31/2018   MCV 90 01/31/2018   PLT 268 01/31/2018    Lab Results  Component Value Date   CREATININE 0.63 01/31/2018   BUN 9 01/31/2018   NA 139 01/31/2018   K 3.6 01/31/2018   CL 103 01/31/2018   CO2 23 01/31/2018    Lab Results  Component Value Date   ALT 8 01/31/2018   AST 12 01/31/2018   ALKPHOS 51 01/31/2018    BILITOT 0.9 01/31/2018    ASSESSMENT AND PLAN:  Tiffany Butler was seen today for abdominal pain.  Diagnoses and all orders for this visit:  Abdominal pain, epigastric normal labs thus far.  I do suspect H. pylori in this patient.  Test is pending.  Will treat for acid blockade for now.  If H. pylori negative and symptomatic relief advised patient continue therapy for 6 weeks and then stop PPI and famotidine. -     Comprehensive metabolic panel -     Lipase -     CBC with Differential -     H. pylori breath test -     famotidine (PEPCID) 40 MG tablet; Take 1 tablet (40 mg total) by mouth at bedtime. Or 30 minutes before dinner. -     pantoprazole (PROTONIX) 40 MG tablet; Take 1 tablet (40 mg total) by mouth daily.  Hepatitis C virus infection without hepatic coma, unspecified chronicity -     Hepatitis c vrs RNA detect by PCR-qual    The patient is advised to call or return to clinic if she does not see an improvement in symptoms, or to seek the care of the closest emergency department if she worsens with the above plan.   Deliah Boston, MHS, PA-C Primary Care at [facility] 02/01/2018 1:43 PM

## 2018-01-31 NOTE — Patient Instructions (Addendum)
COntinue the bland diet.  I am screen labs.     IF you received an x-ray today, you will receive an invoice from [facility]. Please contact [facility] at [phone] with questions or concerns regarding your invoice.   IF you received labwork today, you will receive an invoice from [facility]. Please contact [facility] at [phone] with questions or concerns regarding your invoice.   Our billing staff will not be able to assist you with questions regarding bills from these companies.  You will be contacted with the lab results as soon as they are available. The fastest way to get your results is to activate your My Chart account. Instructions are located on the last page of this paperwork. If you have not heard from us regarding the results in 2 weeks, please contact this office.

## 2018-02-01 LAB — H. PYLORI BREATH TEST: H PYLORI BREATH TEST: NEGATIVE

## 2018-02-02 ENCOUNTER — Encounter: Payer: Self-pay | Admitting: Physician Assistant

## 2018-02-02 LAB — COMPREHENSIVE METABOLIC PANEL
A/G RATIO: 1.5 (ref 1.2–2.2)
ALBUMIN: 4.6 g/dL (ref 3.5–5.5)
ALK PHOS: 51 IU/L (ref 39–117)
ALT: 8 IU/L (ref 0–32)
AST: 12 IU/L (ref 0–40)
BILIRUBIN TOTAL: 0.9 mg/dL (ref 0.0–1.2)
BUN / CREAT RATIO: 14 (ref 9–23)
BUN: 9 mg/dL (ref 6–20)
CHLORIDE: 103 mmol/L (ref 96–106)
CO2: 23 mmol/L (ref 20–29)
Calcium: 9.1 mg/dL (ref 8.7–10.2)
Creatinine, Ser: 0.63 mg/dL (ref 0.57–1.00)
GFR calc Af Amer: 136 mL/min/{1.73_m2} (ref >59)
GFR calc non Af Amer: 118 mL/min/{1.73_m2} (ref >59)
GLOBULIN, TOTAL: 3 g/dL (ref 1.5–4.5)
Glucose: 81 mg/dL (ref 65–99)
POTASSIUM: 3.6 mmol/L (ref 3.5–5.2)
SODIUM: 139 mmol/L (ref 134–144)
Total Protein: 7.6 g/dL (ref 6.0–8.5)

## 2018-02-02 LAB — CBC WITH DIFFERENTIAL/PLATELET
BASOS ABS: 0 10*3/uL (ref 0.0–0.2)
Basos: 1 %
EOS (ABSOLUTE): 0.1 10*3/uL (ref 0.0–0.4)
Eos: 2 %
HEMOGLOBIN: 11.8 g/dL (ref 11.1–15.9)
Hematocrit: 35 % (ref 34.0–46.6)
Immature Grans (Abs): 0 10*3/uL (ref 0.0–0.1)
Immature Granulocytes: 0 %
Lymphocytes Absolute: 1.4 10*3/uL (ref 0.7–3.1)
Lymphs: 34 %
MCH: 30.3 pg (ref 26.6–33.0)
MCHC: 33.7 g/dL (ref 31.5–35.7)
MCV: 90 fL (ref 79–97)
MONOS ABS: 0.4 10*3/uL (ref 0.1–0.9)
Monocytes: 11 %
NEUTROS ABS: 2.2 10*3/uL (ref 1.4–7.0)
Neutrophils: 52 %
Platelets: 268 10*3/uL (ref 150–450)
RBC: 3.9 x10E6/uL (ref 3.77–5.28)
RDW: 11.7 % — AB (ref 12.3–15.4)
WBC: 4.1 10*3/uL (ref 3.4–10.8)

## 2018-02-02 LAB — LIPASE: LIPASE: 50 U/L (ref 14–72)

## 2018-02-02 LAB — HEPATITIS C VRS RNA DETECT BY PCR-QUAL: HCV RNA NAA QUALITATIVE: NEGATIVE

## 2018-02-12 ENCOUNTER — Encounter: Payer: Self-pay | Admitting: Physician Assistant

## 2018-02-21 ENCOUNTER — Encounter: Payer: Self-pay | Admitting: Physician Assistant

## 2018-02-22 ENCOUNTER — Encounter: Payer: Self-pay | Admitting: Physician Assistant

## 2018-02-22 ENCOUNTER — Ambulatory Visit
Admission: RE | Admit: 2018-02-22 | Discharge: 2018-02-22 | Disposition: A | Discharge status: Home / Self Care | Payer: BLUE CROSS/BLUE SHIELD | Source: Ambulatory Visit | Attending: Physician Assistant | Admitting: Physician Assistant

## 2018-02-22 ENCOUNTER — Ambulatory Visit: Payer: BLUE CROSS/BLUE SHIELD | Admitting: Physician Assistant

## 2018-02-22 ENCOUNTER — Ambulatory Visit: Payer: Self-pay

## 2018-02-22 VITALS — BP 106/78 | HR 60 | Temp 98.0°F | Resp 16 | Ht 65.0 in | Wt 141.6 lb

## 2018-02-22 DIAGNOSIS — Z3202 Encounter for pregnancy test, result negative: Secondary | ICD-10-CM | POA: Diagnosis not present

## 2018-02-22 DIAGNOSIS — R10811 Right upper quadrant abdominal tenderness: Secondary | ICD-10-CM | POA: Diagnosis not present

## 2018-02-22 DIAGNOSIS — R1011 Right upper quadrant pain: Secondary | ICD-10-CM | POA: Diagnosis not present

## 2018-02-22 DIAGNOSIS — R198 Other specified symptoms and signs involving the digestive system and abdomen: Secondary | ICD-10-CM

## 2018-02-22 DIAGNOSIS — R1932 Left upper quadrant abdominal rigidity: Secondary | ICD-10-CM | POA: Diagnosis not present

## 2018-02-22 LAB — POCT CBC
Granulocyte percent: 61.5 %G (ref 37–80)
HCT, POC: 39.6 % (ref 37.7–47.9)
Hemoglobin: 13.4 g/dL (ref 12.2–16.2)
LYMPH, POC: 1.6 (ref 0.6–3.4)
MCH: 30.8 pg (ref 27–31.2)
MCHC: 33.7 g/dL (ref 31.8–35.4)
MCV: 91.2 fL (ref 80–97)
MID (CBC): 0.3 (ref 0–0.9)
MPV: 8.7 fL (ref 0–99.8)
POC Granulocyte: 3.1 (ref 2–6.9)
POC LYMPH PERCENT: 32.2 %L (ref 10–50)
POC MID %: 6.3 %M (ref 0–12)
Platelet Count, POC: 268 10*3/uL (ref 142–424)
RBC: 4.34 M/uL (ref 4.04–5.48)
RDW, POC: 12.3 %
WBC: 5.1 10*3/uL (ref 4.6–10.2)

## 2018-02-22 LAB — POCT URINALYSIS DIP (MANUAL ENTRY)
Bilirubin, UA: NEGATIVE
GLUCOSE UA: NEGATIVE mg/dL
Ketones, POC UA: NEGATIVE mg/dL
LEUKOCYTES UA: NEGATIVE
NITRITE UA: NEGATIVE
Protein Ur, POC: NEGATIVE mg/dL
RBC UA: NEGATIVE
Spec Grav, UA: 1.02 (ref 1.010–1.025)
Urobilinogen, UA: 1 E.U./dL
pH, UA: 7.5 (ref 5.0–8.0)

## 2018-02-22 LAB — POCT URINE PREGNANCY: PREG TEST UR: NEGATIVE

## 2018-02-22 IMAGING — CT CT ABDOMEN W/ CM
1 of 2 series · 13 of 32 positions shown, 19 images · IV contrast (iopamidol)
Comparison: None.

CLINICAL DATA: Right upper quadrant pain and tenderness for 1
month.

EXAM:
CT ABDOMEN WITH CONTRAST
TECHNIQUE: Multidetector CT imaging of the abdomen was performed using the
standard protocol following bolus administration of intravenous
contrast.
CONTRAST:  100mL [RT] IOPAMIDOL ([RT]) INJECTION 61%

[Series 2: abdroutine 5.0 i40s 1 · axial · 0.70mm/px · z∈[-272,-38]mm · 13 of 55 slices shown, 19 images]
[im 4/55  soft-tissue]
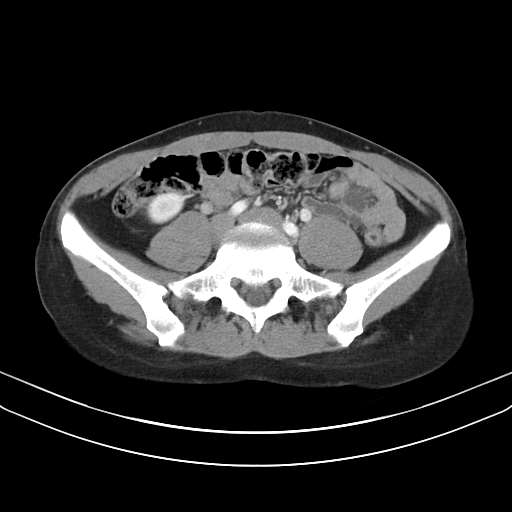
[im 4/55  bone]
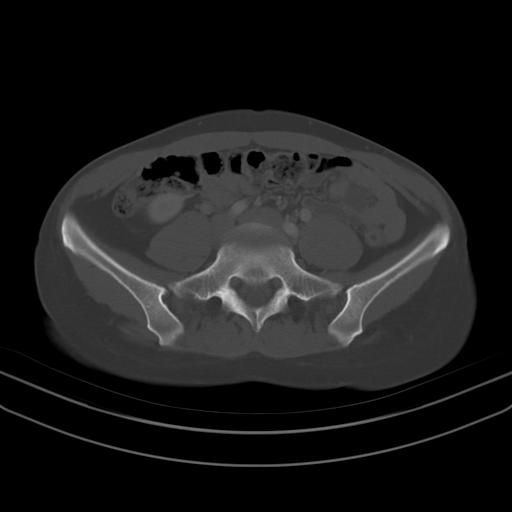
[im 8/55  soft-tissue]
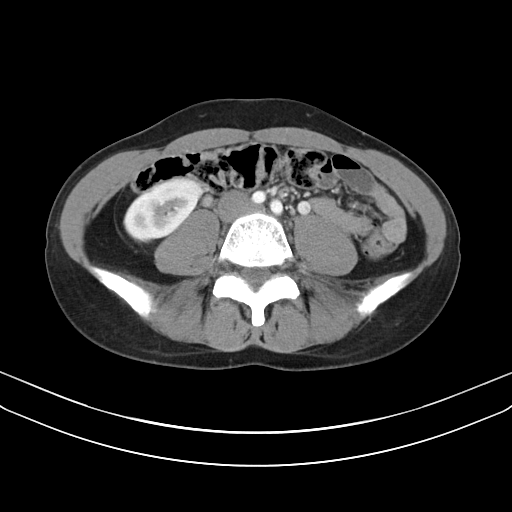
[im 12/55  soft-tissue]
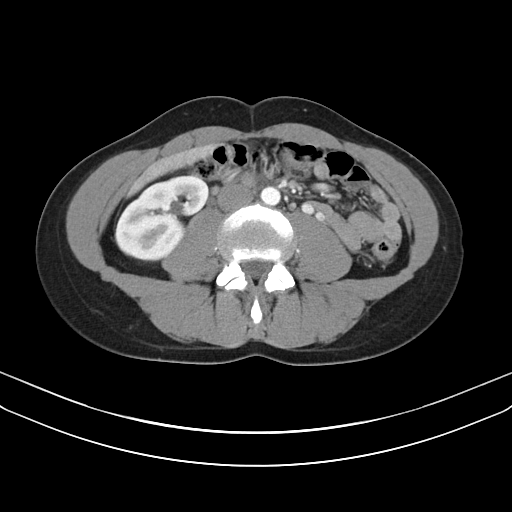
[im 16/55  soft-tissue]
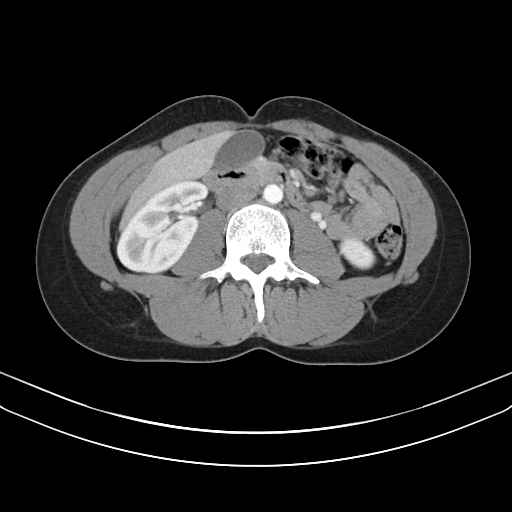
[im 20/55  soft-tissue]
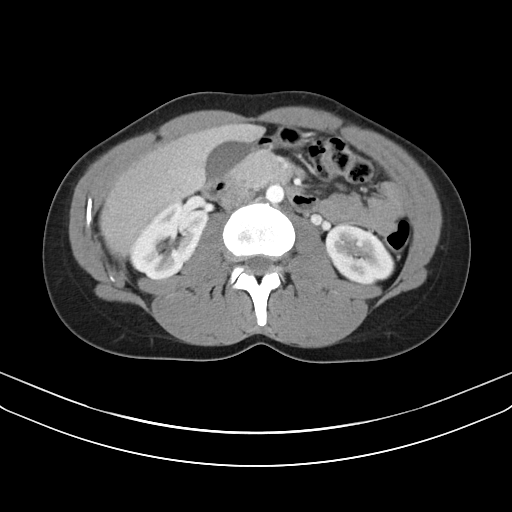
[im 24/55  soft-tissue]
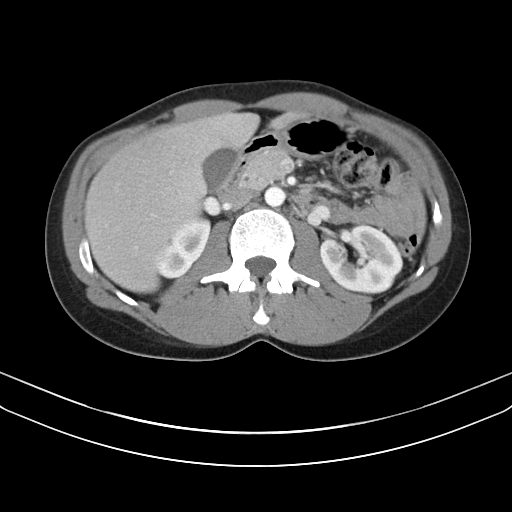
[im 28/55  soft-tissue]
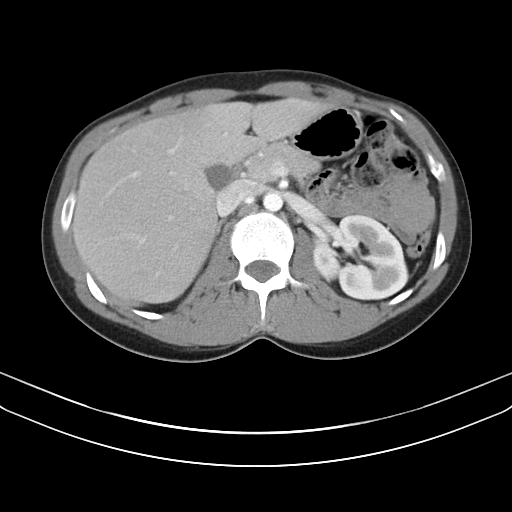
[im 31/55  soft-tissue]
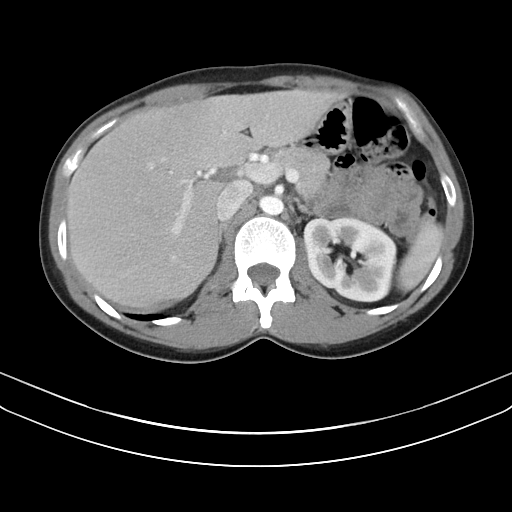
[im 35/55  soft-tissue]
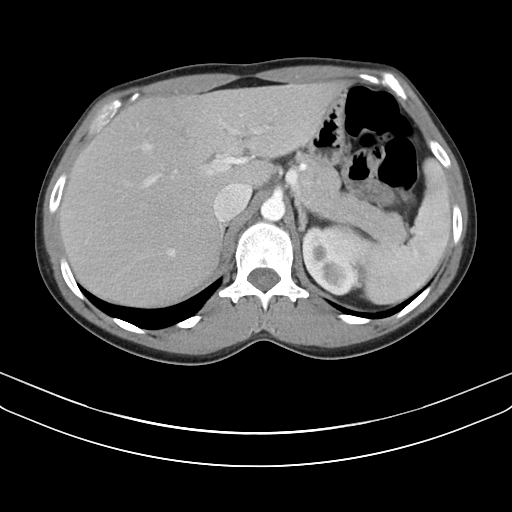
[im 35/55  bone]
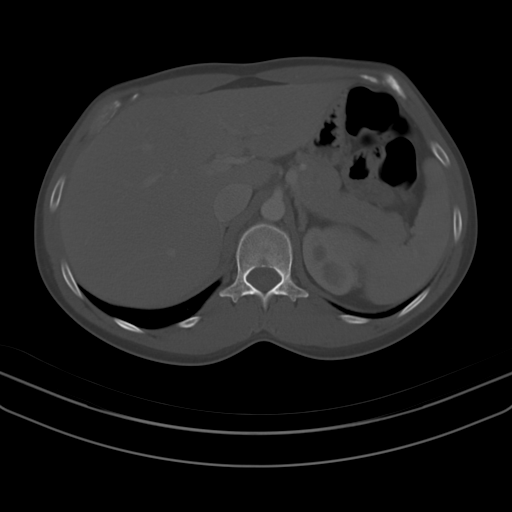
[im 39/55  soft-tissue]
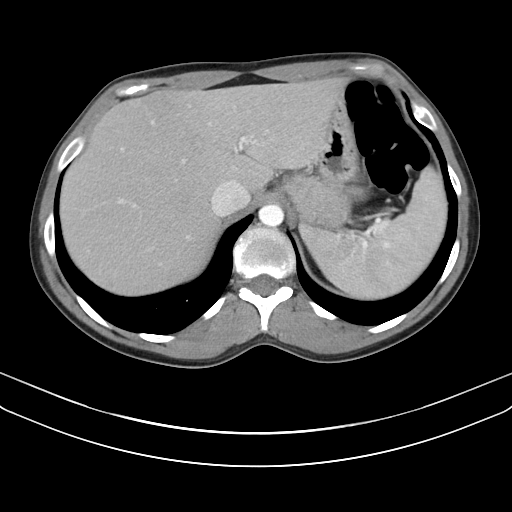
[im 39/55  lung]
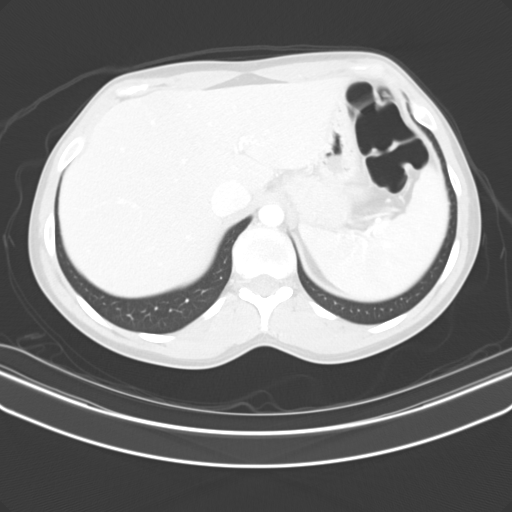
[im 43/55  soft-tissue]
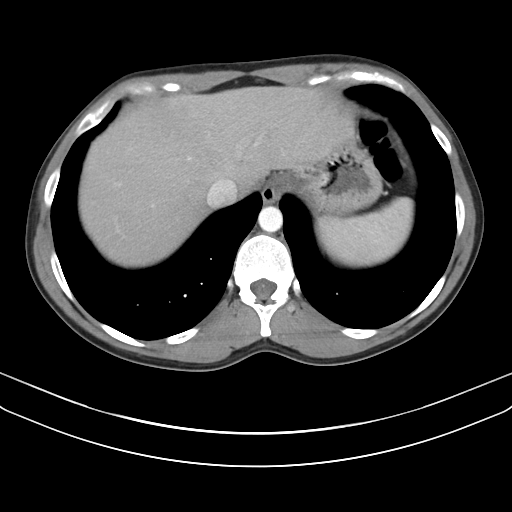
[im 43/55  lung]
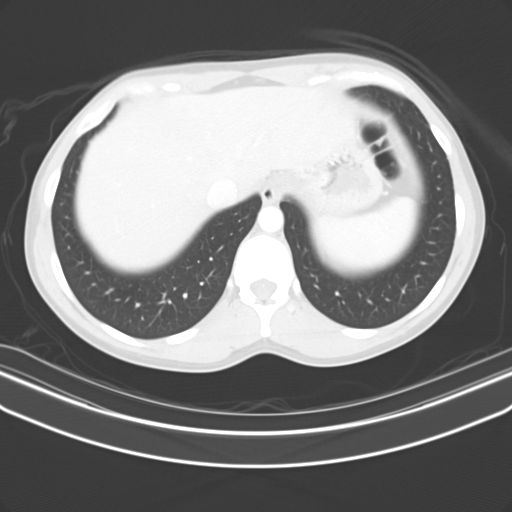
[im 47/55  soft-tissue]
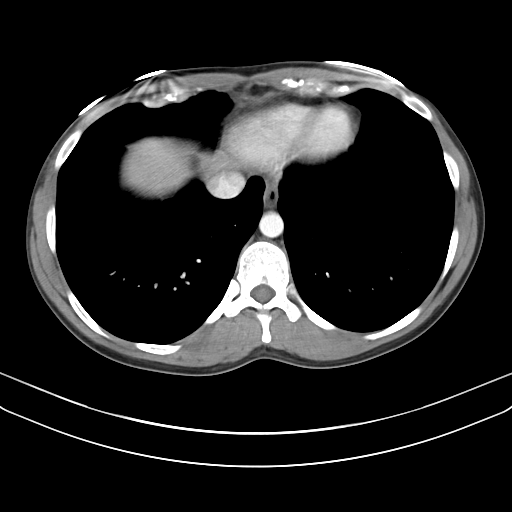
[im 47/55  lung]
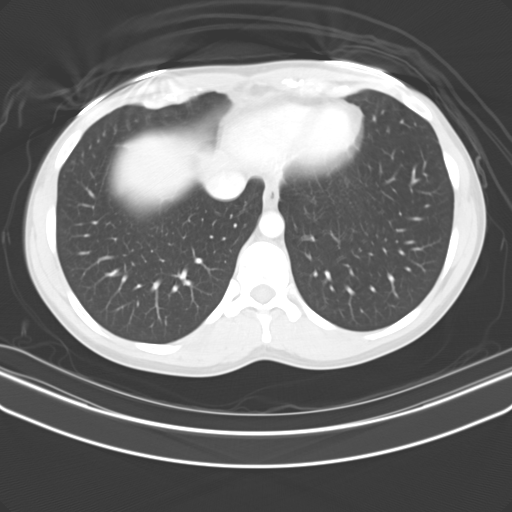
[im 51/55  soft-tissue]
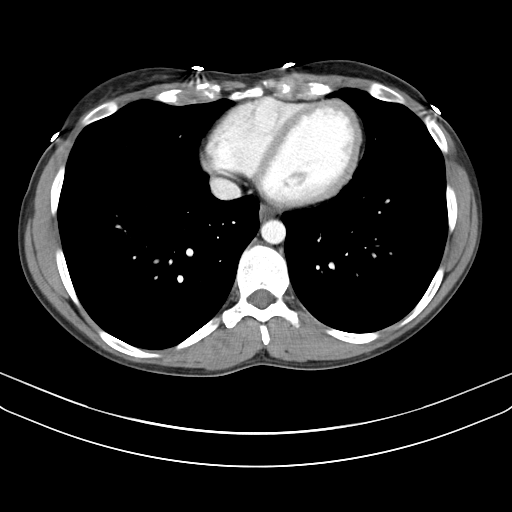
[im 51/55  lung]
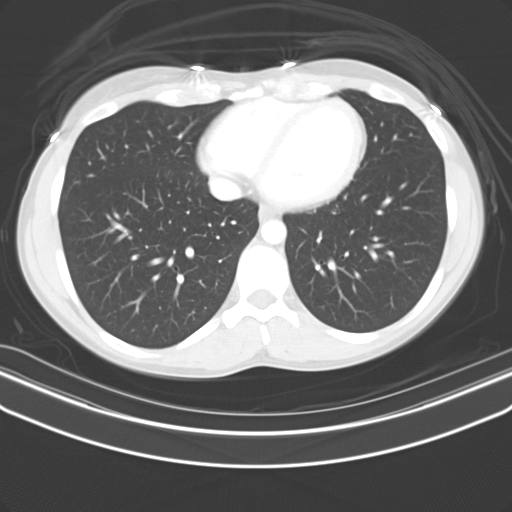

[13 of 32 positions shown; findings below may reference images not displayed]

FINDINGS: Lower chest: No acute findings.

Hepatobiliary: No hepatic masses identified. Gallbladder is
unremarkable.

Pancreas:  No mass or inflammatory changes.

Spleen:  Within normal limits in size and appearance.

Adrenals/Urinary Tract: No masses identified. No evidence of
hydronephrosis.

Stomach/Bowel: Visualized portion unremarkable.

Vascular/Lymphatic: No pathologically enlarged lymph nodes
identified. No abdominal aortic aneurysm.

Other:  None.

Musculoskeletal:  No suspicious bone lesions identified.
IMPRESSION: Negative.  No acute findings or other significant abnormality.

## 2018-02-22 MED ORDER — DICYCLOMINE HCL 10 MG PO CAPS: 10.0000 mg | ORAL_CAPSULE | Freq: Three times a day (TID) | ORAL | 0 refills | Status: DC

## 2018-02-22 MED ORDER — IOPAMIDOL (ISOVUE-300) INJECTION 61%
100.0000 mL | Freq: Once | INTRAVENOUS | Status: AC | PRN
  Administered 2018-02-22: 100 mL via INTRAVENOUS

## 2018-02-22 NOTE — Progress Notes (Addendum)
02/22/2018 4:36 PM   DOB: [birth year] / MRN: [id number]  SUBJECTIVE:  Tiffany Butler is a 32 y.o. female presenting for recheck of abdominal pain. Symptoms have worsened since I have seen her last.  She now associates epigastirc pain with radicular pattern to the back along with right sided pain as well. Symptoms are worse with eating and worst this morning after eating eggs. She denies fever at this time. No difficulty with urination.   She has No Known Allergies.   She  has a past medical history of Hepatitis C antibody test positive, HSV (herpes simplex virus) infection, and Opioid abuse (HCC).    She  reports that she has quit smoking. She has never used smokeless tobacco. She reports that she does not drink alcohol or use drugs. She  reports that she currently engages in sexual activity. The patient  has a past surgical history that includes No past surgeries.  Her family history is not on file.  Review of Systems  Constitutional: Negative for chills, diaphoresis and fever.  Eyes: Negative.   Respiratory: Negative for cough, hemoptysis, sputum production, shortness of breath and wheezing.   Cardiovascular: Negative for chest pain, orthopnea and leg swelling.  Gastrointestinal: Positive for abdominal pain, heartburn and nausea. Negative for blood in stool, constipation, diarrhea, melena and vomiting.  Genitourinary: Negative for flank pain.  Skin: Negative for rash.  Neurological: Negative for dizziness, sensory change, speech change, focal weakness and headaches.    The problem list and medications were reviewed and updated by myself where necessary and exist elsewhere in the encounter.   OBJECTIVE:  BP 106/78 (BP Location: Left Arm, Patient Position: Sitting, Cuff Size: Normal)   Pulse 60   Temp 98 F (36.7 C) (Oral)   Resp 16   Ht 5\' 5"  (1.651 m)   Wt 141 lb 9.6 oz (64.2 kg)   SpO2 100%   BMI 23.56 kg/m   Wt Readings from Last 3 Encounters:  02/22/18 141 lb 9.6 oz  (64.2 kg)  01/31/18 144 lb 6.4 oz (65.5 kg)  03/16/17 143 lb (64.9 kg)   Temp Readings from Last 3 Encounters:  02/22/18 98 F (36.7 C) (Oral)  01/31/18 98 F (36.7 C) (Oral)  03/16/17 98.7 F (37.1 C) (Oral)   BP Readings from Last 3 Encounters:  02/22/18 106/78  01/31/18 90/60  03/16/17 103/63   Pulse Readings from Last 3 Encounters:  02/22/18 60  01/31/18 66  03/16/17 70    Physical Exam  Constitutional: She is oriented to person, place, and time. She appears well-nourished. No distress.  Eyes: Pupils are equal, round, and reactive to light. EOM are normal.  Cardiovascular: Normal rate, regular rhythm, S1 normal, S2 normal, normal heart sounds and intact distal pulses. Exam reveals no gallop, no friction rub and no decreased pulses.  No murmur heard. Pulmonary/Chest: Effort normal. No stridor. No respiratory distress. She has no wheezes. She has no rales.  Abdominal: She exhibits no distension and no mass. There is tenderness (epigastrum and RUQ). There is guarding (epigastrum and RUQ). There is no rebound. No hernia.  Musculoskeletal: She exhibits no edema.  Neurological: She is alert and oriented to person, place, and time. No cranial nerve deficit. Gait normal.  Skin: Skin is dry. She is not diaphoretic.  Psychiatric: She has a normal mood and affect.  Vitals reviewed.   Results for orders placed or performed in visit on 02/22/18  POCT CBC  Result Value Ref Range   WBC  5.1 4.6 - 10.2 K/uL   Lymph, poc 1.6 0.6 - 3.4   POC LYMPH PERCENT 32.2 10 - 50 %L   MID (cbc) 0.3 0 - 0.9   POC MID % 6.3 0 - 12 %M   POC Granulocyte 3.1 2 - 6.9   Granulocyte percent 61.5 37 - 80 %G   RBC 4.34 4.04 - 5.48 M/uL   Hemoglobin 13.4 12.2 - 16.2 g/dL   HCT, POC 59.539.6 63.837.7 - 47.9 %   MCV 91.2 80 - 97 fL   MCH, POC 30.8 27 - 31.2 pg   MCHC 33.7 31.8 - 35.4 g/dL   RDW, POC 75.612.3 %   Platelet Count, POC 268 142 - 424 K/uL   MPV 8.7 0 - 99.8 fL  POCT urinalysis dipstick  Result Value  Ref Range   Color, UA yellow yellow   Clarity, UA clear clear   Glucose, UA negative negative mg/dL   Bilirubin, UA negative negative   Ketones, POC UA negative negative mg/dL   Spec Grav, UA 4.3321.020 9.5181.010 - 1.025   Blood, UA negative negative   pH, UA 7.5 5.0 - 8.0   Protein Ur, POC negative negative mg/dL   Urobilinogen, UA 1.0 0.2 or 1.0 E.U./dL   Nitrite, UA Negative Negative   Leukocytes, UA Negative Negative  POCT urine pregnancy  Result Value Ref Range   Preg Test, Ur Negative Negative   Ct Abdomen W Contrast  Result Date: 02/22/2018 CLINICAL DATA:  Right upper quadrant pain and tenderness for 1 month. EXAM: CT ABDOMEN WITH CONTRAST TECHNIQUE: Multidetector CT imaging of the abdomen was performed using the standard protocol following bolus administration of intravenous contrast. CONTRAST:  100mL ISOVUE-300 IOPAMIDOL (ISOVUE-300) INJECTION 61% COMPARISON:  None. FINDINGS: Lower chest: No acute findings. Hepatobiliary: No hepatic masses identified. Gallbladder is unremarkable. Pancreas:  No mass or inflammatory changes. Spleen:  Within normal limits in size and appearance. Adrenals/Urinary Tract: No masses identified. No evidence of hydronephrosis. Stomach/Bowel: Visualized portion unremarkable. Vascular/Lymphatic: No pathologically enlarged lymph nodes identified. No abdominal aortic aneurysm. Other:  None. Musculoskeletal:  No suspicious bone lesions identified. IMPRESSION: Negative.  No acute findings or other significant abnormality. Electronically Signed   By: Myles RosenthalJohn  Stahl M.D.   On: 02/22/2018 14:55     ASSESSMENT AND PLAN:  Noreene LarssonJill was seen today for follow-up.  Diagnoses and all orders for this visit:  Guarding of epigastrium  Right upper quadrant abdominal tenderness without rebound tenderness: See last OV.  No improving on PPI and H2 blocker.  All of her labs thus far have been normal. CT scan abdomen today to rule in acute gallbladder and pancreatitis given ab exam. If  negative then I will refer her to GI.  She can try a low fat diet.  If that fails likely IBS or eosinophilic esophagitis. Will try bentyl.  -     Cancel: CT Abdomen Pelvis W Contrast; Future -     CMP and Liver -     Lipase -     POCT CBC -     POCT urinalysis dipstick -     POCT urine pregnancy -     CT ABDOMEN W CONTRAST; Future  Other orders -     Cancel: CT ABDOMEN LIMITED W CONTRAST; Future -     Cancel: CT ABDOMEN LIMITED W CONTRAST; Future -     dicyclomine (BENTYL) 10 MG capsule; Take 1-2 capsules (10-20 mg total) by mouth 4 (four) times daily -  before  meals and at bedtime.    The patient is advised to call or return to clinic if she does not see an improvement in symptoms, or to seek the care of the closest emergency department if she worsens with the above plan.   Deliah Boston, MHS, PA-C Primary Care at [facility] 02/22/2018 4:36 PM

## 2018-02-22 NOTE — Patient Instructions (Addendum)
   [address] [phone] [id number] WALK-IN  IF you received an x-ray today, you will receive an invoice from [facility]. Please contact [facility] at [phone] with questions or concerns regarding your invoice.   IF you received labwork today, you will receive an invoice from [facility]. Please contact [facility] at [phone] with questions or concerns regarding your invoice.   Our billing staff will not be able to assist you with questions regarding bills from these companies.  You will be contacted with the lab results as soon as they are available. The fastest way to get your results is to activate your My Chart account. Instructions are located on the last page of this paperwork. If you have not heard from us regarding the results in 2 weeks, please contact this office.

## 2018-02-22 NOTE — Telephone Encounter (Signed)
Incoming call from patient from [facility] with complaints of gastritis flare ups and now developing chest pains on the left side.  "near her bra strap".  Denies pain radiating.  Started approximately 40 minutes ago.  Patient states that it is intermittent.  Last less than a minute.  Rates the pain mild.  Denies Cardiac disease and pulmonary disease. When asked what the patient thinks may be causing these symptoms; patient states" I don't know, I don't understand." Other issues would the the gastritis that she is being treated for.  Denies pregnancy. On birth control pills.  Patient has an appointment today at  11;40 am. Wanted reassurance that she was not having heart issues this am.  After triaging patient recommended patient keep appointment.  If chest pains worsens call office back, or seek evaluation ar ER.  Patient voiced understanding.        Reason for Disposition . Chest pain(s) lasting a few seconds  Protocols used: CHEST PAIN-A-AH

## 2018-02-22 NOTE — Addendum Note (Signed)
Addended by: Ofilia NeasLARK, MICHAEL L on: 02/22/2018 04:38 PM   Modules accepted: Orders

## 2018-02-23 ENCOUNTER — Ambulatory Visit (INDEPENDENT_AMBULATORY_CARE_PROVIDER_SITE_OTHER): Payer: BLUE CROSS/BLUE SHIELD | Admitting: Physician Assistant

## 2018-02-23 DIAGNOSIS — Z5321 Procedure and treatment not carried out due to patient leaving prior to being seen by health care provider: Secondary | ICD-10-CM

## 2018-02-23 LAB — CMP AND LIVER
ALK PHOS: 58 IU/L (ref 39–117)
ALT: 11 IU/L (ref 0–32)
AST: 14 IU/L (ref 0–40)
Albumin: 4.8 g/dL (ref 3.5–5.5)
BUN: 11 mg/dL (ref 6–20)
Bilirubin Total: 1.1 mg/dL (ref 0.0–1.2)
Bilirubin, Direct: 0.23 mg/dL (ref 0.00–0.40)
CALCIUM: 9.5 mg/dL (ref 8.7–10.2)
CO2: 21 mmol/L (ref 20–29)
CREATININE: 0.72 mg/dL (ref 0.57–1.00)
Chloride: 102 mmol/L (ref 96–106)
GFR calc Af Amer: 127 mL/min/{1.73_m2} (ref >59)
GFR calc non Af Amer: 110 mL/min/{1.73_m2} (ref >59)
Glucose: 78 mg/dL (ref 65–99)
POTASSIUM: 4.1 mmol/L (ref 3.5–5.2)
Sodium: 141 mmol/L (ref 134–144)
TOTAL PROTEIN: 8.1 g/dL (ref 6.0–8.5)

## 2018-02-23 LAB — LIPASE: LIPASE: 58 U/L (ref 14–72)

## 2018-02-23 NOTE — Addendum Note (Signed)
Addended by: Isaac BlissGALLOWAY, Cordella Nyquist J on: 02/23/2018 11:14 AM   Modules accepted: Orders

## 2018-02-23 NOTE — Progress Notes (Signed)
Return visit for labs from OV on 02/22/18.  However, patient stated after seeing the estimated cost she declined the allergen test.

## 2018-02-25 NOTE — Telephone Encounter (Signed)
Spoke with pt, feeling much better this morning. Referral for GI is currently pending approval.

## 2018-02-27 ENCOUNTER — Encounter: Payer: Self-pay | Admitting: Gastroenterology

## 2018-03-18 ENCOUNTER — Other Ambulatory Visit: Payer: Self-pay | Admitting: Physician Assistant

## 2018-03-18 NOTE — Telephone Encounter (Signed)
Bentyl refill Last Refill:02/22/18 # 240 Last OV: 02/22/18 PCP: Tiffany Butler  Pharm[facility]: [facility] [address] [place], [place]

## 2018-03-20 NOTE — Telephone Encounter (Signed)
Patient is requesting a refill of the following medications: Requested Prescriptions   Pending Prescriptions Disp Refills  . dicyclomine (BENTYL) 10 MG capsule [Pharmacy Med Name: DICYCLOMINE 10 MG CAPSULE] 240 capsule 0    Sig: TAKE 1-2 CAPSULES BY MOUTH 4 TIMES DAILY - BEFORE MEALS AND AT BEDTIME.    Date of patient request: 03/18/18 Last office visit: 02/23/18  Date of last refill: 02/22/18 Last refill amount: #240 0RF  Follow up time period per chart: none scheduled at this time

## 2018-04-02 ENCOUNTER — Other Ambulatory Visit: Payer: Self-pay | Admitting: Physician Assistant

## 2018-04-02 NOTE — Telephone Encounter (Signed)
dicyclomine refill Last Refill:02/22/18 # 720 Last OV:02/22/18 PCP: Deliah BostonMichael Clark [facility]: [facility] [address] [place], [place]

## 2018-04-15 NOTE — Telephone Encounter (Signed)
This was routed back to the nurse by office pool / E-Prescribing Status: Transmission to pharmacy failed (04/15/2018 1:56 PM EDT) / Shella Spearing was not transmitted correctly.  Please resend this medication to the pharmacy if appropriate. / Please note: do not route items back to individual nurses at the [facility] as they do not check individual baskets.

## 2018-04-25 ENCOUNTER — Ambulatory Visit: Payer: BLUE CROSS/BLUE SHIELD | Admitting: Gastroenterology

## 2018-04-25 ENCOUNTER — Encounter

## 2018-10-15 DIAGNOSIS — Z01419 Encounter for gynecological examination (general) (routine) without abnormal findings: Secondary | ICD-10-CM | POA: Diagnosis not present

## 2018-10-15 DIAGNOSIS — Z309 Encounter for contraceptive management, unspecified: Secondary | ICD-10-CM | POA: Diagnosis not present

## 2018-10-15 DIAGNOSIS — Z6824 Body mass index (BMI) 24.0-24.9, adult: Secondary | ICD-10-CM | POA: Diagnosis not present

## 2018-10-15 DIAGNOSIS — R87610 Atypical squamous cells of undetermined significance on cytologic smear of cervix (ASC-US): Secondary | ICD-10-CM | POA: Diagnosis not present

## 2018-10-15 DIAGNOSIS — Z124 Encounter for screening for malignant neoplasm of cervix: Secondary | ICD-10-CM | POA: Diagnosis not present

## 2019-08-08 NOTE — L&D Delivery Note (Signed)
DELIVERY NOTE  Pt complete and at +2 station with urge to push. Epidural controlling pain. Pt pushed and delivered a viable female infant in LOA position. Deep variable decels noted while +3, tight scar tissue from previous repair noted. Median episiotomy cut, head delivered with next contractions. Loose nuchal x1, easily reduced. Anterior and posterior shoulders spontaneously delivered with next two pushes; body easily followed next. Infant placed on mothers abdomen and bulb suction of mouth and nose performed. Cord was then clamped and cut by FOB. Cord blood obtained, 3VC; cord very thin. Baby had a vigorous spontaneous cry noted. Placenta then delivered at 1746 intact. Fundal massage performed and pitocin per protocol. Fundus firm. The following lacerations were noted: Median episiotomy. Repaired in routine fashion with 2-0 vicryl. Small clot noted with fundal massage afterwards, LUS atony on BME, small 2x2cm membrane retrieved. Firm fundus, bleeding improved with administration of IM methergine. Mother and baby stable. Counts correct. EBL 350cc  Infant time: 74 Gender: female Placenta time: 1746 Apgars: 7/9 Weight: pending skin-to-skin

## 2019-10-13 DIAGNOSIS — N911 Secondary amenorrhea: Secondary | ICD-10-CM | POA: Diagnosis not present

## 2019-10-13 DIAGNOSIS — Z3201 Encounter for pregnancy test, result positive: Secondary | ICD-10-CM | POA: Diagnosis not present

## 2019-11-05 DIAGNOSIS — Z3A09 9 weeks gestation of pregnancy: Secondary | ICD-10-CM | POA: Diagnosis not present

## 2019-11-05 DIAGNOSIS — Z3689 Encounter for other specified antenatal screening: Secondary | ICD-10-CM | POA: Diagnosis not present

## 2019-11-05 DIAGNOSIS — Z113 Encounter for screening for infections with a predominantly sexual mode of transmission: Secondary | ICD-10-CM | POA: Diagnosis not present

## 2019-11-05 DIAGNOSIS — N841 Polyp of cervix uteri: Secondary | ICD-10-CM | POA: Diagnosis not present

## 2019-11-05 DIAGNOSIS — A6004 Herpesviral vulvovaginitis: Secondary | ICD-10-CM | POA: Diagnosis not present

## 2019-11-05 DIAGNOSIS — O26891 Other specified pregnancy related conditions, first trimester: Secondary | ICD-10-CM | POA: Diagnosis not present

## 2019-11-05 LAB — OB RESULTS CONSOLE ABO/RH: RH Type: POSITIVE

## 2019-11-05 LAB — OB RESULTS CONSOLE RPR: RPR: NONREACTIVE

## 2019-11-05 LAB — OB RESULTS CONSOLE RUBELLA ANTIBODY, IGM: Rubella: IMMUNE

## 2019-11-05 LAB — OB RESULTS CONSOLE HIV ANTIBODY (ROUTINE TESTING): HIV: NONREACTIVE

## 2019-11-05 LAB — OB RESULTS CONSOLE HEPATITIS B SURFACE ANTIGEN: Hepatitis B Surface Ag: NEGATIVE

## 2019-11-05 LAB — OB RESULTS CONSOLE GC/CHLAMYDIA
Chlamydia: NEGATIVE
Gonorrhea: NEGATIVE

## 2019-11-05 LAB — OB RESULTS CONSOLE ANTIBODY SCREEN: Antibody Screen: NEGATIVE

## 2020-05-06 LAB — OB RESULTS CONSOLE GBS: GBS: NEGATIVE

## 2020-06-07 ENCOUNTER — Encounter (HOSPITAL_COMMUNITY): Payer: Self-pay | Admitting: Obstetrics and Gynecology

## 2020-06-07 ENCOUNTER — Inpatient Hospital Stay (HOSPITAL_COMMUNITY): Payer: BC Managed Care – PPO | Admitting: Anesthesiology

## 2020-06-07 ENCOUNTER — Encounter (HOSPITAL_COMMUNITY): Payer: Self-pay | Admitting: *Deleted

## 2020-06-07 ENCOUNTER — Other Ambulatory Visit: Payer: Self-pay

## 2020-06-07 ENCOUNTER — Inpatient Hospital Stay (HOSPITAL_COMMUNITY)
Admission: AD | Admit: 2020-06-07 | Discharge: 2020-06-08 | DRG: 807 | Disposition: A | Discharge status: Home / Self Care | Payer: BC Managed Care – PPO | Attending: Obstetrics and Gynecology | Admitting: Obstetrics and Gynecology

## 2020-06-07 ENCOUNTER — Telehealth (HOSPITAL_COMMUNITY): Payer: Self-pay | Admitting: *Deleted

## 2020-06-07 DIAGNOSIS — Z20822 Contact with and (suspected) exposure to covid-19: Secondary | ICD-10-CM | POA: Diagnosis present

## 2020-06-07 DIAGNOSIS — Z3A40 40 weeks gestation of pregnancy: Secondary | ICD-10-CM

## 2020-06-07 DIAGNOSIS — O26893 Other specified pregnancy related conditions, third trimester: Secondary | ICD-10-CM | POA: Diagnosis present

## 2020-06-07 DIAGNOSIS — Z87891 Personal history of nicotine dependence: Secondary | ICD-10-CM | POA: Diagnosis not present

## 2020-06-07 LAB — RESPIRATORY PANEL BY RT PCR (FLU A&B, COVID)
Influenza A by PCR: NEGATIVE
Influenza B by PCR: NEGATIVE
SARS Coronavirus 2 by RT PCR: NEGATIVE

## 2020-06-07 LAB — CBC
HCT: 39.1 % (ref 36.0–46.0)
Hemoglobin: 13 g/dL (ref 12.0–15.0)
MCH: 31.9 pg (ref 26.0–34.0)
MCHC: 33.2 g/dL (ref 30.0–36.0)
MCV: 96.1 fL (ref 80.0–100.0)
Platelets: 240 10*3/uL (ref 150–400)
RBC: 4.07 MIL/uL (ref 3.87–5.11)
RDW: 12.4 % (ref 11.5–15.5)
WBC: 14.5 10*3/uL — ABNORMAL HIGH (ref 4.0–10.5)
nRBC: 0 % (ref 0.0–0.2)

## 2020-06-07 LAB — TYPE AND SCREEN
ABO/RH(D): B POS
Antibody Screen: NEGATIVE

## 2020-06-07 MED ORDER — ACETAMINOPHEN 325 MG PO TABS
650.0000 mg | ORAL_TABLET | ORAL | Status: DC | PRN
  Administered 2020-06-07 – 2020-06-08 (×2): 650 mg via ORAL
  Filled 2020-06-07 (×2): qty 2

## 2020-06-07 MED ORDER — ONDANSETRON HCL 4 MG PO TABS: 4.0000 mg | ORAL_TABLET | ORAL | Status: DC | PRN

## 2020-06-07 MED ORDER — ONDANSETRON HCL 4 MG/2ML IJ SOLN: 4.0000 mg | INTRAMUSCULAR | Status: DC | PRN

## 2020-06-07 MED ORDER — TERBUTALINE SULFATE 1 MG/ML IJ SOLN
INTRAMUSCULAR | Status: AC
  Administered 2020-06-07: 1 mg
  Filled 2020-06-07: qty 1

## 2020-06-07 MED ORDER — SODIUM CHLORIDE (PF) 0.9 % IJ SOLN
INTRAMUSCULAR | Status: DC | PRN
  Administered 2020-06-07: 12 mL/h via EPIDURAL

## 2020-06-07 MED ORDER — ACETAMINOPHEN 325 MG PO TABS: 650.0000 mg | ORAL_TABLET | ORAL | Status: DC | PRN

## 2020-06-07 MED ORDER — EPHEDRINE 5 MG/ML INJ: 10.0000 mg | INTRAVENOUS | Status: DC | PRN

## 2020-06-07 MED ORDER — BUTORPHANOL TARTRATE 1 MG/ML IJ SOLN
1.0000 mg | INTRAMUSCULAR | Status: DC | PRN
  Administered 2020-06-07: 1 mg via INTRAVENOUS
  Filled 2020-06-07: qty 1

## 2020-06-07 MED ORDER — PHENYLEPHRINE 40 MCG/ML (10ML) SYRINGE FOR IV PUSH (FOR BLOOD PRESSURE SUPPORT)
80.0000 ug | PREFILLED_SYRINGE | INTRAVENOUS | Status: DC | PRN
  Administered 2020-06-07: 80 ug via INTRAVENOUS

## 2020-06-07 MED ORDER — LACTATED RINGERS IV SOLN: 500.0000 mL | Freq: Once | INTRAVENOUS | Status: DC

## 2020-06-07 MED ORDER — PRENATAL MULTIVITAMIN CH
1.0000 | ORAL_TABLET | Freq: Every day | ORAL | Status: DC
  Administered 2020-06-08: 1 via ORAL
  Filled 2020-06-07: qty 1

## 2020-06-07 MED ORDER — ZOLPIDEM TARTRATE 5 MG PO TABS: 5.0000 mg | ORAL_TABLET | Freq: Every evening | ORAL | Status: DC | PRN

## 2020-06-07 MED ORDER — OXYTOCIN BOLUS FROM INFUSION: 333.0000 mL | Freq: Once | INTRAVENOUS | Status: DC

## 2020-06-07 MED ORDER — OXYCODONE-ACETAMINOPHEN 5-325 MG PO TABS: 1.0000 | ORAL_TABLET | ORAL | Status: DC | PRN

## 2020-06-07 MED ORDER — FLEET ENEMA 7-19 GM/118ML RE ENEM: 1.0000 | ENEMA | RECTAL | Status: DC | PRN

## 2020-06-07 MED ORDER — METHYLERGONOVINE MALEATE 0.2 MG/ML IJ SOLN: 0.2000 mg | Freq: Once | INTRAMUSCULAR | Status: DC

## 2020-06-07 MED ORDER — SOD CITRATE-CITRIC ACID 500-334 MG/5ML PO SOLN: 30.0000 mL | ORAL | Status: DC | PRN

## 2020-06-07 MED ORDER — FENTANYL-BUPIVACAINE-NACL 0.5-0.125-0.9 MG/250ML-% EP SOLN
12.0000 mL/h | EPIDURAL | Status: DC | PRN
  Filled 2020-06-07: qty 250

## 2020-06-07 MED ORDER — PHENYLEPHRINE 40 MCG/ML (10ML) SYRINGE FOR IV PUSH (FOR BLOOD PRESSURE SUPPORT)
80.0000 ug | PREFILLED_SYRINGE | INTRAVENOUS | Status: DC | PRN
  Administered 2020-06-07: 80 ug via INTRAVENOUS
  Filled 2020-06-07: qty 10

## 2020-06-07 MED ORDER — WITCH HAZEL-GLYCERIN EX PADS: 1.0000 "application " | MEDICATED_PAD | CUTANEOUS | Status: DC | PRN

## 2020-06-07 MED ORDER — OXYTOCIN-SODIUM CHLORIDE 30-0.9 UT/500ML-% IV SOLN: 2.5000 [IU]/h | INTRAVENOUS | Status: DC

## 2020-06-07 MED ORDER — TETANUS-DIPHTH-ACELL PERTUSSIS 5-2.5-18.5 LF-MCG/0.5 IM SUSY: 0.5000 mL | PREFILLED_SYRINGE | Freq: Once | INTRAMUSCULAR | Status: DC

## 2020-06-07 MED ORDER — SENNOSIDES-DOCUSATE SODIUM 8.6-50 MG PO TABS
2.0000 | ORAL_TABLET | ORAL | Status: DC
  Administered 2020-06-07: 2 via ORAL
  Filled 2020-06-07: qty 2

## 2020-06-07 MED ORDER — OXYTOCIN-SODIUM CHLORIDE 30-0.9 UT/500ML-% IV SOLN
1.0000 m[IU]/min | INTRAVENOUS | Status: DC
  Filled 2020-06-07: qty 500

## 2020-06-07 MED ORDER — LACTATED RINGERS IV SOLN: INTRAVENOUS | Status: DC

## 2020-06-07 MED ORDER — LIDOCAINE-EPINEPHRINE (PF) 2 %-1:200000 IJ SOLN
INTRAMUSCULAR | Status: DC | PRN
  Administered 2020-06-07: 5 mL via EPIDURAL

## 2020-06-07 MED ORDER — LACTATED RINGERS IV SOLN
500.0000 mL | INTRAVENOUS | Status: DC | PRN
  Administered 2020-06-07: 500 mL via INTRAVENOUS

## 2020-06-07 MED ORDER — DIPHENHYDRAMINE HCL 50 MG/ML IJ SOLN: 12.5000 mg | INTRAMUSCULAR | Status: DC | PRN

## 2020-06-07 MED ORDER — SIMETHICONE 80 MG PO CHEW: 80.0000 mg | CHEWABLE_TABLET | ORAL | Status: DC | PRN

## 2020-06-07 MED ORDER — DIPHENHYDRAMINE HCL 25 MG PO CAPS: 25.0000 mg | ORAL_CAPSULE | Freq: Four times a day (QID) | ORAL | Status: DC | PRN

## 2020-06-07 MED ORDER — TERBUTALINE SULFATE 1 MG/ML IJ SOLN: 0.2500 mg | Freq: Once | INTRAMUSCULAR | Status: DC | PRN

## 2020-06-07 MED ORDER — LIDOCAINE HCL (PF) 1 % IJ SOLN: 30.0000 mL | INTRAMUSCULAR | Status: DC | PRN

## 2020-06-07 MED ORDER — COCONUT OIL OIL: 1.0000 "application " | TOPICAL_OIL | Status: DC | PRN

## 2020-06-07 MED ORDER — IBUPROFEN 600 MG PO TABS
600.0000 mg | ORAL_TABLET | Freq: Four times a day (QID) | ORAL | Status: DC
  Administered 2020-06-07 – 2020-06-08 (×4): 600 mg via ORAL
  Filled 2020-06-07 (×4): qty 1

## 2020-06-07 MED ORDER — DIBUCAINE (PERIANAL) 1 % EX OINT: 1.0000 "application " | TOPICAL_OINTMENT | CUTANEOUS | Status: DC | PRN

## 2020-06-07 MED ORDER — BENZOCAINE-MENTHOL 20-0.5 % EX AERO
1.0000 "application " | INHALATION_SPRAY | CUTANEOUS | Status: DC | PRN
  Filled 2020-06-07 (×2): qty 56

## 2020-06-07 MED ORDER — METHYLERGONOVINE MALEATE 0.2 MG/ML IJ SOLN
INTRAMUSCULAR | Status: AC
  Administered 2020-06-07: 0.2 mg
  Filled 2020-06-07: qty 1

## 2020-06-07 MED ORDER — ONDANSETRON HCL 4 MG/2ML IJ SOLN: 4.0000 mg | Freq: Four times a day (QID) | INTRAMUSCULAR | Status: DC | PRN

## 2020-06-07 MED ORDER — OXYCODONE-ACETAMINOPHEN 5-325 MG PO TABS: 2.0000 | ORAL_TABLET | ORAL | Status: DC | PRN

## 2020-06-07 NOTE — Progress Notes (Signed)
Spontaneous prolonged FHR deceleration at 1445 to 60s x43m, occurred with repositioning to left lateral positional. Foley placed, patient repositioned to right lateral, SRTB of 140s. CE unchanged at 8cm.   Consented patient that, if spontaneous FHR deceleration occurs again and CE unchanged, may need to proceed with PLTCS for cat 2 strip remote from delivery R/B/A of cesarean section discussed with patient. Alternative would be vaginal delivery which would mean shorter postpartum stay and decreased risk of bleeding. Risks of section include infection of the uterus, pelvic organs, or skin, inadvertent injury to internal organs, such as bowel or bladder. If there is major injury, extensive surgery may be required. If injury is minor, it may be treated with relative ease. Discussed possibility of excessive blood loss and transfusion. If bleeding cannot be controlled using medical or minor surgical methods, a cesarean hysterectomy may be performed which would mean no future fertility. Patient accepts the possibility of blood transfusion, if necessary. Patient understands and agrees to move forward with section if needed

## 2020-06-07 NOTE — Progress Notes (Signed)
Unclear with patient off monitor vs FHR deceleration. Presented to bedside, pt standing up, not on monitor. Slight VB, very thin. CE now 7-8/90/0. Quickly progressing, likely source of VB. CBC still in process, pt desires epidural if labs come back soon

## 2020-06-07 NOTE — H&P (Addendum)
Tiffany Butler is a 34 y.o. female presenting for r/o labor. +FM, denies LOF, some scant VB since arriving in MAU. Painful contractions q65m since 0500. Was 1cm on 10/28  [facility] c/b HSV on ppx (no prodromal symptoms), h/o Hep C (undetectable viral load in 2017), AMA (low risk panorama). GBS neg. Received flu and TDAP in office  OB History    Gravida  3   Para  1   Term  1   Preterm  0   AB  1   Living  1     SAB  0   TAB  1   Ectopic  0   Multiple  0   Live Births  1          Past Medical History:  Diagnosis Date  . Hepatitis C antibody test positive   . HSV (herpes simplex virus) infection   . Opioid abuse (HCC)    hx of   Past Surgical History:  Procedure Laterality Date  . NO PAST SURGERIES     Family History: family history is not on file. Social History:  reports that she has quit smoking. She has never used smokeless tobacco. She reports that she does not drink alcohol and does not use drugs.     Maternal Diabetes: No 1hr 121 Genetic Screening: Normal Maternal Ultrasounds/Referrals: Normal Fetal Ultrasounds or other Referrals:  None Maternal Substance Abuse:  No remote h/o substance use, sober 5+ years Significant Maternal Medications:  None Significant Maternal Lab Results:  Group B Strep negative Other Comments:  None  Review of Systems  Constitutional: Negative for chills and fever.  Respiratory: Negative for shortness of breath.   Cardiovascular: Negative for chest pain, palpitations and leg swelling.  Gastrointestinal: Positive for abdominal pain (w/ ctx). Negative for vomiting.  Neurological: Negative for dizziness, weakness and headaches.  Psychiatric/Behavioral: Negative for suicidal ideas.   Maternal Medical History:  Reason for admission: Contractions.   Contractions: Onset was 3-5 hours ago.   Frequency: regular.   Perceived severity is strong.    Fetal activity: Perceived fetal activity is normal.    Prenatal complications: No  bleeding, PIH or pre-eclampsia.   Prenatal Complications - Diabetes: none.    Dilation: 5 Effacement (%): 90 Station: -1 Exam by:SS, MD Bloody show present  Blood pressure 130/72, pulse 68, temperature 98.3 F (36.8 C), resp. rate 16, height 5\' 5"  (1.651 m), weight 80.7 kg, SpO2 99 %. Exam Physical Exam Constitutional:      General: She is not in acute distress.    Appearance: She is well-developed.  HENT:     Head: Normocephalic and atraumatic.  Eyes:     Pupils: Pupils are equal, round, and reactive to light.  Cardiovascular:     Rate and Rhythm: Normal rate and regular rhythm.     Heart sounds: No murmur heard.  No gallop.   Abdominal:     Tenderness: There is no abdominal tenderness. There is no guarding or rebound.  Genitourinary:    Vagina: Normal.  Musculoskeletal:        General: Normal range of motion.     Cervical back: Normal range of motion and neck supple.  Skin:    General: Skin is warm and dry.  Neurological:     Mental Status: She is alert and oriented to person, place, and time.     Prenatal labs: ABO, Rh: B/Positive/-- (03/31 0000) Antibody: Negative (03/31 0000) Rubella: Immune (03/31 0000) RPR: Nonreactive (03/31 0000)  HBsAg:  Negative (03/31 0000)  HIV: Non-reactive (03/31 0000)  GBS: Negative/-- (09/30 0000)   Assessment/Plan: This is a 34yo G3P1011 @ 40 4/7 admitted in labor. [facility] c/b h/o Hep C, HSV, AMA. GBS neg. Baby girl, anticipate SVD. CE 5/80/-1 just now, pt called out. Desires epidural, labs drawn.    Tiffany Butler 06/07/2020, 12:24 PM

## 2020-06-07 NOTE — Progress Notes (Signed)
Called by RN at 1406 regarding FHR deceleration after epidural placement. FHR to 90s. Patient placed left then right lateral then on hands and knees. Per staff, FHR improved audibly to 110s then dropped again to 90s. I presented at 1416 after patient received terbutaline at 1413 and was able to examine patient (was in outpatient office). FHR back to 120s. Patient placed right lateral, CE 8/90/0. AROM with light meconium noted. FSE placed without issue.   Currently baseline 140, min var, no accels or decels. Has tolerated 3 ctxs over 61m without decelerations. Consider peanut ball in 70m if FHR tolerates

## 2020-06-07 NOTE — Anesthesia Preprocedure Evaluation (Signed)
Anesthesia Evaluation  Patient identified by MRN, date of birth, ID band Patient awake    Reviewed: Allergy & Precautions, NPO status , Patient's Chart, lab work & pertinent test results  Airway Mallampati: II  TM Distance: >3 FB Neck ROM: Full    Dental no notable dental hx.    Pulmonary neg pulmonary ROS, former smoker,    Pulmonary exam normal breath sounds clear to auscultation       Cardiovascular negative cardio ROS Normal cardiovascular exam Rhythm:Regular Rate:Normal     Neuro/Psych negative neurological ROS  negative psych ROS   GI/Hepatic negative GI ROS, (+)     substance abuse  , Hepatitis -, C  Endo/Other  negative endocrine ROS  Renal/GU negative Renal ROS  negative genitourinary   Musculoskeletal negative musculoskeletal ROS (+) narcotic dependent  Abdominal   Peds  Hematology negative hematology ROS (+)   Anesthesia Other Findings   Reproductive/Obstetrics (+) Pregnancy                             Anesthesia Physical Anesthesia Plan  ASA: II  Anesthesia Plan: Epidural   Post-op Pain Management:    Induction:   PONV Risk Score and Plan: Treatment may vary due to age or medical condition  Airway Management Planned: Natural Airway  Additional Equipment:   Intra-op Plan:   Post-operative Plan:   Informed Consent: I have reviewed the patients History and Physical, chart, labs and discussed the procedure including the risks, benefits and alternatives for the proposed anesthesia with the patient or authorized representative who has indicated his/her understanding and acceptance.       Plan Discussed with: Anesthesiologist  Anesthesia Plan Comments: (Patient identified. Risks, benefits, options discussed with patient including but not limited to bleeding, infection, nerve damage, paralysis, failed block, incomplete pain control, headache, blood pressure  changes, nausea, vomiting, reactions to medication, itching, and post partum back pain. Confirmed with bedside nurse the patient's most recent platelet count. Confirmed with the patient that they are not taking any anticoagulation, have any bleeding history or any family history of bleeding disorders. Patient expressed understanding and wishes to proceed. All questions were answered. )        Anesthesia Quick Evaluation

## 2020-06-07 NOTE — MAU Note (Signed)
.   Tiffany Butler is a 34 y.o. at [redacted]w[redacted]d here in MAU reporting: contractions that started at 5am. Denies any LOF  Onset of complaint:0500 Pain score: 7 Vitals:   06/07/20 1121  BP: 130/72  Pulse: 68  Resp: 16  Temp: 98.3 F (36.8 C)  SpO2: 99%     FHT:135 Lab orders placed from triage:

## 2020-06-07 NOTE — Telephone Encounter (Signed)
Preadmission screen Pt declined coming for outpatient covid test due to ucs 10 minutes apart at home.

## 2020-06-07 NOTE — Anesthesia Procedure Notes (Signed)
Epidural Patient location during procedure: OB Start time: 06/07/2020 1:50 PM End time: 06/07/2020 2:00 PM  Staffing Anesthesiologist: Elmer Picker, MD Performed: anesthesiologist   Preanesthetic Checklist Completed: patient identified, IV checked, risks and benefits discussed, monitors and equipment checked, pre-op evaluation and timeout performed  Epidural Patient position: sitting Prep: DuraPrep and site prepped and draped Patient monitoring: continuous pulse ox, blood pressure, heart rate and cardiac monitor Approach: midline Location: L3-L4 Injection technique: LOR air  Needle:  Needle type: Tuohy  Needle gauge: 17 G Needle length: 9 cm Needle insertion depth: 5 cm Catheter type: closed end flexible Catheter size: 19 Gauge Catheter at skin depth: 11 cm Test dose: negative  Assessment Sensory level: T8 Events: blood not aspirated, injection not painful, no injection resistance, no paresthesia and negative IV test  Additional Notes Patient identified. Risks/Benefits/Options discussed with patient including but not limited to bleeding, infection, nerve damage, paralysis, failed block, incomplete pain control, headache, blood pressure changes, nausea, vomiting, reactions to medication both or allergic, itching and postpartum back pain. Confirmed with bedside nurse the patient's most recent platelet count. Confirmed with patient that they are not currently taking any anticoagulation, have any bleeding history or any family history of bleeding disorders. Patient expressed understanding and wished to proceed. All questions were answered. Sterile technique was used throughout the entire procedure. Please see nursing notes for vital signs. Test dose was given through epidural catheter and negative prior to continuing to dose epidural or start infusion. Warning signs of high block given to the patient including shortness of breath, tingling/numbness in hands, complete motor block,  or any concerning symptoms with instructions to call for help. Patient was given instructions on fall risk and not to get out of bed. All questions and concerns addressed with instructions to call with any issues or inadequate analgesia.  Reason for block:procedure for pain

## 2020-06-08 ENCOUNTER — Inpatient Hospital Stay (HOSPITAL_COMMUNITY): Payer: BC Managed Care – PPO

## 2020-06-08 ENCOUNTER — Inpatient Hospital Stay (HOSPITAL_COMMUNITY)
Admission: AD | Admit: 2020-06-08 | Payer: BC Managed Care – PPO | Source: Home / Self Care | Admitting: Obstetrics and Gynecology

## 2020-06-08 LAB — CBC
HCT: 33.8 % — ABNORMAL LOW (ref 36.0–46.0)
Hemoglobin: 11.4 g/dL — ABNORMAL LOW (ref 12.0–15.0)
MCH: 32.4 pg (ref 26.0–34.0)
MCHC: 33.7 g/dL (ref 30.0–36.0)
MCV: 96 fL (ref 80.0–100.0)
Platelets: 185 10*3/uL (ref 150–400)
RBC: 3.52 MIL/uL — ABNORMAL LOW (ref 3.87–5.11)
RDW: 12.8 % (ref 11.5–15.5)
WBC: 10.3 10*3/uL (ref 4.0–10.5)
nRBC: 0 % (ref 0.0–0.2)

## 2020-06-08 LAB — RPR: RPR Ser Ql: NONREACTIVE

## 2020-06-08 MED ORDER — IBUPROFEN 600 MG PO TABS: 600.0000 mg | ORAL_TABLET | Freq: Four times a day (QID) | ORAL | 0 refills | Status: AC

## 2020-06-08 NOTE — Discharge Summary (Signed)
Postpartum Discharge Summary      Patient Name: Tiffany Butler DOB: 1985/10/22 MRN: [id number]  Date of admission: 06/07/2020 Delivery date:06/07/2020  Delivering provider: Carlisle Cater  Date of discharge: 06/08/2020  Admitting diagnosis: Normal labor [O80, Z37.9] Intrauterine pregnancy: [redacted]w[redacted]d     Secondary diagnosis:  Active Problems:   Normal labor     Discharge diagnosis: Term Pregnancy Delivered                                               Hospital course: Onset of Labor With Vaginal Delivery      34 y.o. yo [id number] at [redacted]w[redacted]d was admitted in Active Labor on 06/07/2020. Patient had an uncomplicated labor course as follows:  Membrane Rupture Time/Date: 2:21 PM ,06/07/2020   Delivery Method:Vaginal, Spontaneous  Episiotomy: Left Mediolateral  Lacerations:  None  Patient had an uncomplicated postpartum course.  She is ambulating, tolerating a regular diet, passing flatus, and urinating well. Patient is discharged home in stable condition on 06/08/20.  Newborn Data: Birth date:06/07/2020  Birth time:5:43 PM  Gender:Female  Living status:Living  Apgars:7 ,9  Weight:3059 g    Physical exam  Vitals:   06/07/20 2007 06/07/20 2108 06/08/20 0155 06/08/20 0542  BP: 112/67 126/74 117/70 119/83  Pulse: 71 64 73 64  Resp: 16 18 17 16   Temp: 98 F (36.7 C) 98 F (36.7 C) 98.1 F (36.7 C) 97.9 F (36.6 C)  TempSrc: Oral Oral Oral Oral  SpO2: 100% 100% 97% 100%  Weight:      Height:       General: alert Lochia: appropriate Uterine Fundus: firm  Labs: Lab Results  Component Value Date   WBC 10.3 06/08/2020   HGB 11.4 (L) 06/08/2020   HCT 33.8 (L) 06/08/2020   MCV 96.0 06/08/2020   PLT 185 06/08/2020   CMP Latest Ref Rng & Units 02/22/2018  Glucose 65 - 99 mg/dL 78  BUN 6 - 20 mg/dL 11  Creatinine 02/24/2018 - 1.47 mg/dL 8.29  Sodium 5.62 - 130 mmol/L 141  Potassium 3.5 - 5.2 mmol/L 4.1  Chloride 96 - 106 mmol/L 102  CO2 20 - 29 mmol/L 21  Calcium 8.7 - 10.2 mg/dL  9.5  Total Protein 6.0 - 8.5 g/dL 8.1  Total Bilirubin 0.0 - 1.2 mg/dL 1.1  Alkaline Phos 39 - 117 IU/L 58  AST 0 - 40 IU/L 14  ALT 0 - 32 IU/L 11   Edinburgh Score: No flowsheet data found.    After visit meds:  Allergies as of 06/08/2020   No Known Allergies     Medication List    STOP taking these medications   famotidine 40 MG tablet Commonly known as: PEPCID   norethindrone-ethinyl estradiol 1-20 MG-MCG tablet Commonly known as: LOESTRIN FE   pantoprazole 40 MG tablet Commonly known as: PROTONIX   valACYclovir 1000 MG tablet Commonly known as: VALTREX     TAKE these medications   acetaminophen 500 MG tablet Commonly known as: TYLENOL Take 500 mg by mouth every 6 (six) hours as needed. For pain relief   dicyclomine 10 MG capsule Commonly known as: BENTYL TAKE 1-2 CAPSULES BY MOUTH 4 TIMES DAILY - BEFORE MEALS AND AT BEDTIME.   ibuprofen 600 MG tablet Commonly known as: ADVIL Take 1 tablet (600 mg total) by mouth every 6 (six) hours.  Discharge home in stable condition Infant Feeding: Breast Infant Disposition:home with mother Discharge instruction: per After Visit Summary and Postpartum booklet. Activity: Advance as tolerated. Pelvic rest for 6 weeks.  Diet: routine diet  Postpartum Appointment:6 weeks Follow up Visit:  Follow-up Information    Eluzer Howdeshell, MD. Schedule an appointment as soon as possible for a visit in 6 week(s).   Specialty: Obstetrics and Gynecology Contact information: [address] [place] [address] [phone]                   06/08/2020 Zenaida Niece, MD

## 2020-06-08 NOTE — Discharge Instructions (Signed)
As per discharge pamphlet °

## 2020-06-08 NOTE — Social Work (Signed)
CSW received consult for hx of opiod use.   Referral was screened out due to the following: ~ MOB had no documented substance use after initial prenatal visit/+UPT. ~ MOB had no positive drug screens after initial prenatal visit/+UPT. ~ Per chart review, MOB has been sober for 5+ years.  Please contact the Clinical Social Worker if needs arise, by MOB request, or if MOB scores greater than 9/yes to question 10 on Edinburgh Postpartum Depression Screen.  Naliya Gish, LCSWA Clinical Social Work [facility]  [phone] 

## 2020-06-08 NOTE — Anesthesia Postprocedure Evaluation (Signed)
Anesthesia Post Note  Patient: Tiffany Butler  Procedure(s) Performed: AN AD HOC LABOR EPIDURAL     Patient location during evaluation: Mother Baby Anesthesia Type: Epidural Level of consciousness: awake and alert Pain management: pain level controlled Vital Signs Assessment: post-procedure vital signs reviewed and stable Respiratory status: spontaneous breathing, nonlabored ventilation and respiratory function stable Cardiovascular status: stable Postop Assessment: no headache, no backache and epidural receding Anesthetic complications: no   No complications documented.  Last Vitals:  Vitals:   06/08/20 0155 06/08/20 0542  BP: 117/70 119/83  Pulse: 73 64  Resp: 17 16  Temp: 36.7 C 36.6 C  SpO2: 97% 100%    Last Pain:  Vitals:   06/08/20 0728  TempSrc:   PainSc: 1    Pain Goal:                   Junious Silk

## 2020-06-08 NOTE — Progress Notes (Signed)
PPD #1 No problems, would like to go home tonight if possible Afeb, VSS Fundus firm, NT at U-1 Continue routine postpartum care, d/c home this pm if baby able to go

## 2020-06-09 ENCOUNTER — Inpatient Hospital Stay (HOSPITAL_COMMUNITY): Payer: BC Managed Care – PPO

## 2020-06-10 LAB — SURGICAL PATHOLOGY
